# Patient Record
Sex: Female | Born: 1960 | Race: Black or African American | Hispanic: No | Marital: Single | Smoking: Never smoker
Health system: Southern US, Community
[De-identification: ages and names within clinical notes are randomized; demographics above are authoritative.]

## PROBLEM LIST (undated history)

## (undated) DIAGNOSIS — T7840XA Allergy, unspecified, initial encounter: Secondary | ICD-10-CM

## (undated) DIAGNOSIS — F329 Major depressive disorder, single episode, unspecified: Secondary | ICD-10-CM

## (undated) DIAGNOSIS — F32A Depression, unspecified: Secondary | ICD-10-CM

## (undated) DIAGNOSIS — M1990 Unspecified osteoarthritis, unspecified site: Secondary | ICD-10-CM

## (undated) DIAGNOSIS — E785 Hyperlipidemia, unspecified: Secondary | ICD-10-CM

## (undated) HISTORY — DX: Hyperlipidemia, unspecified: E78.5

## (undated) HISTORY — DX: Allergy, unspecified, initial encounter: T78.40XA

## (undated) HISTORY — DX: Depression, unspecified: F32.A

## (undated) HISTORY — DX: Major depressive disorder, single episode, unspecified: F32.9

## (undated) HISTORY — PX: UTERINE FIBROID EMBOLIZATION: SHX825

## (undated) HISTORY — DX: Unspecified osteoarthritis, unspecified site: M19.90

---

## 2000-01-21 ENCOUNTER — Ambulatory Visit (HOSPITAL_COMMUNITY): Admission: RE | Admit: 2000-01-21 | Discharge: 2000-01-21 | Payer: Self-pay | Admitting: Family Medicine

## 2000-01-21 ENCOUNTER — Encounter: Payer: Self-pay | Admitting: Family Medicine

## 2004-05-07 ENCOUNTER — Other Ambulatory Visit: Admission: RE | Admit: 2004-05-07 | Discharge: 2004-05-07 | Payer: Self-pay | Admitting: Obstetrics and Gynecology

## 2004-12-05 ENCOUNTER — Encounter: Admission: RE | Admit: 2004-12-05 | Discharge: 2004-12-05 | Payer: Self-pay | Admitting: Obstetrics and Gynecology

## 2006-08-17 ENCOUNTER — Encounter: Admission: RE | Admit: 2006-08-17 | Discharge: 2006-08-17 | Payer: Self-pay | Admitting: Obstetrics and Gynecology

## 2006-08-18 ENCOUNTER — Other Ambulatory Visit: Admission: RE | Admit: 2006-08-18 | Discharge: 2006-08-18 | Payer: Self-pay | Admitting: Obstetrics and Gynecology

## 2006-08-19 ENCOUNTER — Encounter: Admission: RE | Admit: 2006-08-19 | Discharge: 2006-08-19 | Payer: Self-pay | Admitting: Obstetrics and Gynecology

## 2006-10-08 ENCOUNTER — Encounter: Admission: RE | Admit: 2006-10-08 | Discharge: 2006-10-08 | Payer: Self-pay | Admitting: Diagnostic Radiology

## 2006-10-12 ENCOUNTER — Ambulatory Visit (HOSPITAL_COMMUNITY): Admission: RE | Admit: 2006-10-12 | Discharge: 2006-10-12 | Payer: Self-pay | Admitting: Diagnostic Radiology

## 2006-10-16 ENCOUNTER — Inpatient Hospital Stay (HOSPITAL_COMMUNITY): Admission: RE | Admit: 2006-10-16 | Discharge: 2006-10-16 | Payer: Self-pay | Admitting: Diagnostic Radiology

## 2006-12-08 ENCOUNTER — Encounter: Admission: RE | Admit: 2006-12-08 | Discharge: 2006-12-08 | Payer: Self-pay | Admitting: Diagnostic Radiology

## 2007-04-14 ENCOUNTER — Encounter: Admission: RE | Admit: 2007-04-14 | Discharge: 2007-04-14 | Payer: Self-pay | Admitting: Diagnostic Radiology

## 2007-04-18 ENCOUNTER — Encounter: Admission: RE | Admit: 2007-04-18 | Discharge: 2007-04-18 | Payer: Self-pay | Admitting: Diagnostic Radiology

## 2007-04-19 ENCOUNTER — Encounter: Admission: RE | Admit: 2007-04-19 | Discharge: 2007-04-19 | Payer: Self-pay | Admitting: Diagnostic Radiology

## 2009-05-30 ENCOUNTER — Other Ambulatory Visit: Admission: RE | Admit: 2009-05-30 | Discharge: 2009-05-30 | Payer: Self-pay | Admitting: Nurse Practitioner

## 2010-09-09 ENCOUNTER — Other Ambulatory Visit: Admission: RE | Admit: 2010-09-09 | Discharge: 2010-09-09 | Payer: Self-pay | Admitting: Obstetrics and Gynecology

## 2010-11-23 ENCOUNTER — Encounter: Payer: Self-pay | Admitting: Diagnostic Radiology

## 2012-02-25 ENCOUNTER — Other Ambulatory Visit (HOSPITAL_COMMUNITY)
Admission: RE | Admit: 2012-02-25 | Discharge: 2012-02-25 | Disposition: A | Discharge status: Home / Self Care | Payer: 59 | Source: Ambulatory Visit | Attending: Obstetrics and Gynecology | Admitting: Obstetrics and Gynecology

## 2012-02-25 ENCOUNTER — Other Ambulatory Visit: Payer: Self-pay | Admitting: Obstetrics and Gynecology

## 2012-02-25 DIAGNOSIS — Z01419 Encounter for gynecological examination (general) (routine) without abnormal findings: Secondary | ICD-10-CM | POA: Insufficient documentation

## 2012-03-24 ENCOUNTER — Other Ambulatory Visit: Payer: Self-pay | Admitting: Obstetrics and Gynecology

## 2013-03-20 ENCOUNTER — Other Ambulatory Visit (HOSPITAL_COMMUNITY)
Admission: RE | Admit: 2013-03-20 | Discharge: 2013-03-20 | Disposition: A | Discharge status: Home / Self Care | Payer: Commercial Indemnity | Source: Ambulatory Visit | Attending: Obstetrics and Gynecology | Admitting: Obstetrics and Gynecology

## 2013-03-20 ENCOUNTER — Other Ambulatory Visit: Payer: Self-pay | Admitting: Obstetrics and Gynecology

## 2013-03-20 DIAGNOSIS — R87810 Cervical high risk human papillomavirus (HPV) DNA test positive: Secondary | ICD-10-CM | POA: Insufficient documentation

## 2013-03-20 DIAGNOSIS — Z01419 Encounter for gynecological examination (general) (routine) without abnormal findings: Secondary | ICD-10-CM | POA: Insufficient documentation

## 2013-03-20 DIAGNOSIS — Z1151 Encounter for screening for human papillomavirus (HPV): Secondary | ICD-10-CM | POA: Insufficient documentation

## 2013-11-27 ENCOUNTER — Other Ambulatory Visit (HOSPITAL_COMMUNITY)
Admission: RE | Admit: 2013-11-27 | Discharge: 2013-11-27 | Disposition: A | Discharge status: Home / Self Care | Payer: Commercial Indemnity | Source: Ambulatory Visit | Attending: Obstetrics and Gynecology | Admitting: Obstetrics and Gynecology

## 2013-11-27 ENCOUNTER — Other Ambulatory Visit: Payer: Self-pay | Admitting: Obstetrics and Gynecology

## 2013-11-27 DIAGNOSIS — R87810 Cervical high risk human papillomavirus (HPV) DNA test positive: Secondary | ICD-10-CM | POA: Insufficient documentation

## 2013-11-27 DIAGNOSIS — Z124 Encounter for screening for malignant neoplasm of cervix: Secondary | ICD-10-CM | POA: Insufficient documentation

## 2013-11-27 DIAGNOSIS — Z1151 Encounter for screening for human papillomavirus (HPV): Secondary | ICD-10-CM | POA: Insufficient documentation

## 2013-12-18 ENCOUNTER — Other Ambulatory Visit: Payer: Self-pay | Admitting: Obstetrics and Gynecology

## 2015-04-02 ENCOUNTER — Other Ambulatory Visit: Payer: Self-pay | Admitting: Obstetrics and Gynecology

## 2015-04-02 ENCOUNTER — Other Ambulatory Visit (HOSPITAL_COMMUNITY)
Admission: RE | Admit: 2015-04-02 | Discharge: 2015-04-02 | Disposition: A | Discharge status: Home / Self Care | Payer: Commercial Indemnity | Source: Ambulatory Visit | Attending: Obstetrics and Gynecology | Admitting: Obstetrics and Gynecology

## 2015-04-02 DIAGNOSIS — Z01411 Encounter for gynecological examination (general) (routine) with abnormal findings: Secondary | ICD-10-CM | POA: Diagnosis present

## 2015-04-02 DIAGNOSIS — Z1151 Encounter for screening for human papillomavirus (HPV): Secondary | ICD-10-CM | POA: Diagnosis present

## 2015-04-02 DIAGNOSIS — R87810 Cervical high risk human papillomavirus (HPV) DNA test positive: Secondary | ICD-10-CM | POA: Insufficient documentation

## 2015-04-02 DIAGNOSIS — Z1231 Encounter for screening mammogram for malignant neoplasm of breast: Secondary | ICD-10-CM

## 2015-04-03 LAB — CYTOLOGY - PAP

## 2015-04-15 ENCOUNTER — Ambulatory Visit: Payer: Commercial Indemnity

## 2015-04-22 ENCOUNTER — Ambulatory Visit
Admission: RE | Admit: 2015-04-22 | Discharge: 2015-04-22 | Disposition: A | Discharge status: Home / Self Care | Payer: Commercial Indemnity | Source: Ambulatory Visit | Attending: Obstetrics and Gynecology | Admitting: Obstetrics and Gynecology

## 2015-04-22 DIAGNOSIS — Z1231 Encounter for screening mammogram for malignant neoplasm of breast: Secondary | ICD-10-CM

## 2015-10-01 ENCOUNTER — Other Ambulatory Visit (HOSPITAL_COMMUNITY)
Admission: RE | Admit: 2015-10-01 | Discharge: 2015-10-01 | Disposition: A | Discharge status: Home / Self Care | Payer: Commercial Indemnity | Source: Ambulatory Visit | Attending: Obstetrics and Gynecology | Admitting: Obstetrics and Gynecology

## 2015-10-01 ENCOUNTER — Other Ambulatory Visit: Payer: Self-pay | Admitting: Obstetrics and Gynecology

## 2015-10-01 DIAGNOSIS — Z1151 Encounter for screening for human papillomavirus (HPV): Secondary | ICD-10-CM | POA: Diagnosis not present

## 2015-10-01 DIAGNOSIS — Z01411 Encounter for gynecological examination (general) (routine) with abnormal findings: Secondary | ICD-10-CM | POA: Insufficient documentation

## 2015-10-02 LAB — CYTOLOGY - PAP

## 2015-11-22 ENCOUNTER — Other Ambulatory Visit: Payer: Self-pay | Admitting: Obstetrics and Gynecology

## 2016-02-08 IMAGING — MG MM SCREEN MAMMOGRAM BILATERAL
5 series · 5 of 5 positions shown · non-contrast
Comparison: None.

CLINICAL DATA: Screening.

EXAM:
DIGITAL SCREENING BILATERAL MAMMOGRAM WITH CAD

[R CC (1 of 2)]
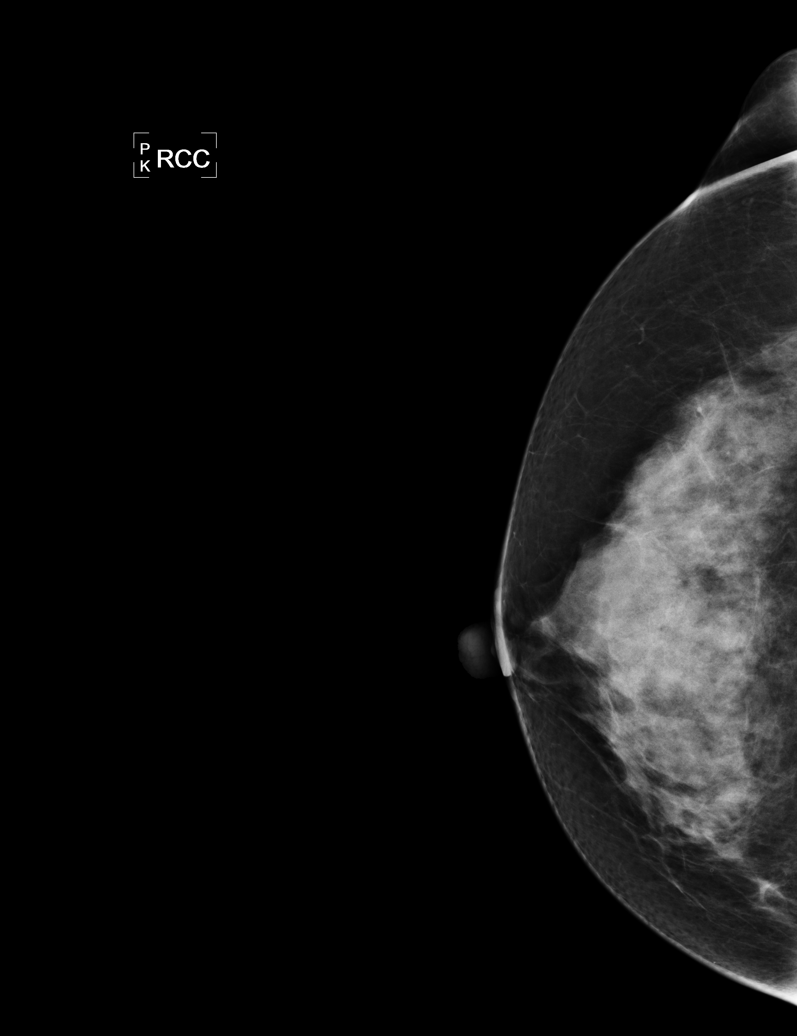

[L CC]
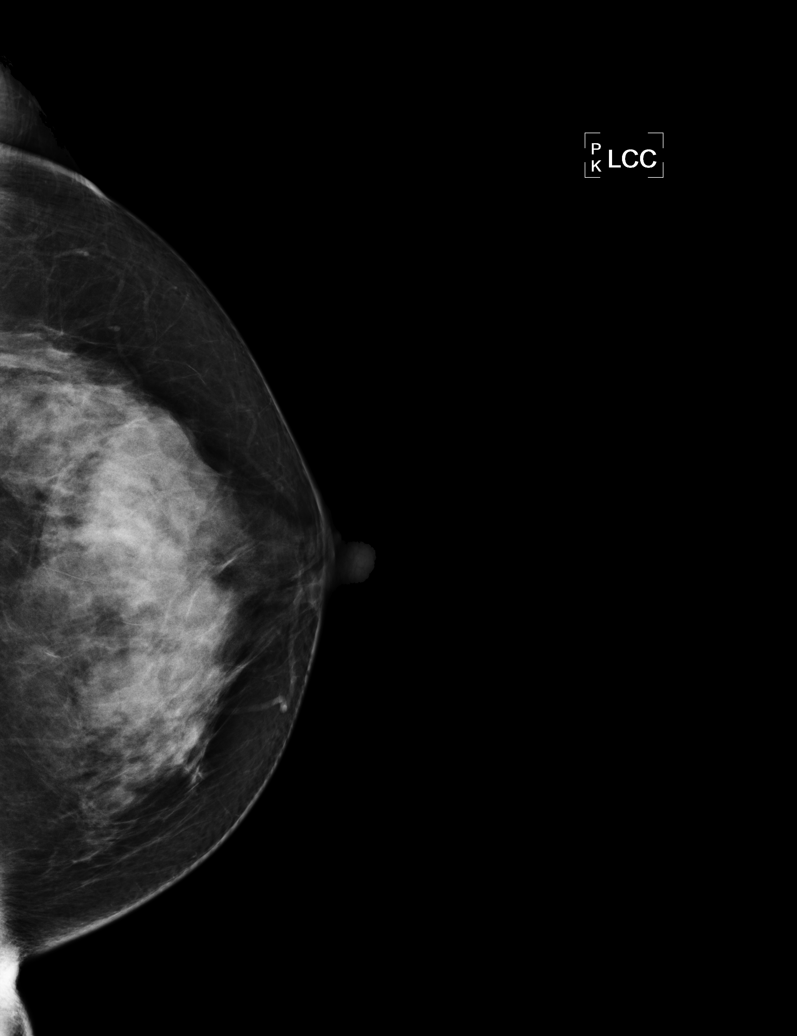

[L MLO]
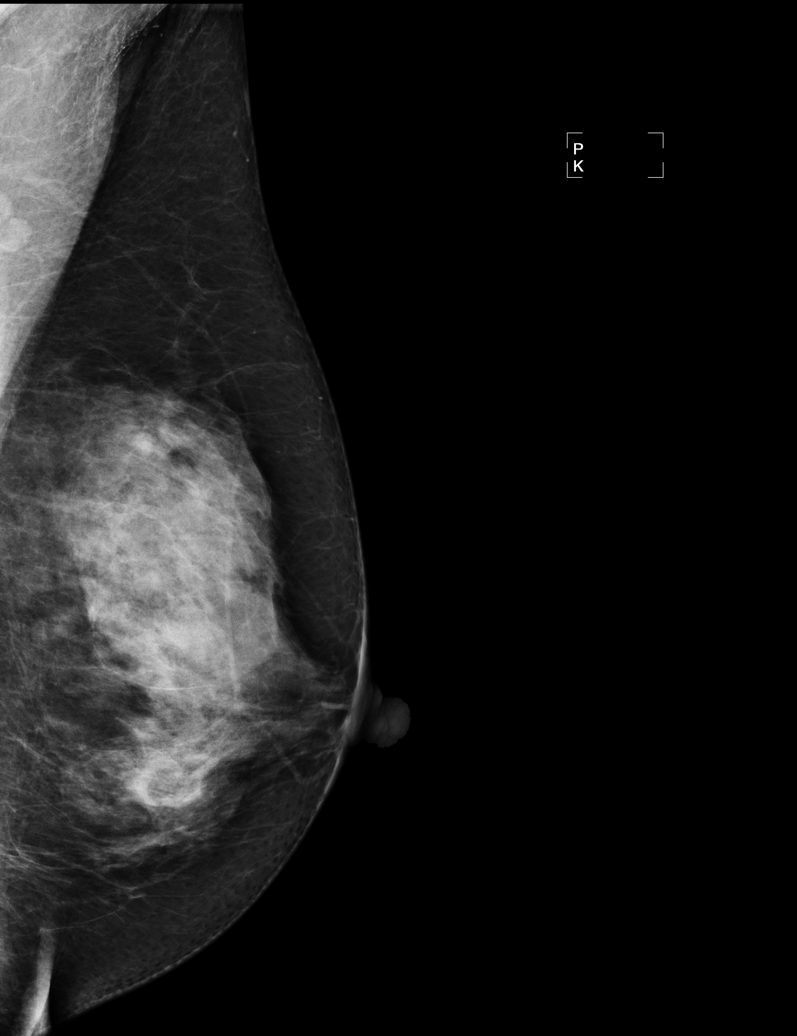

[R MLO]
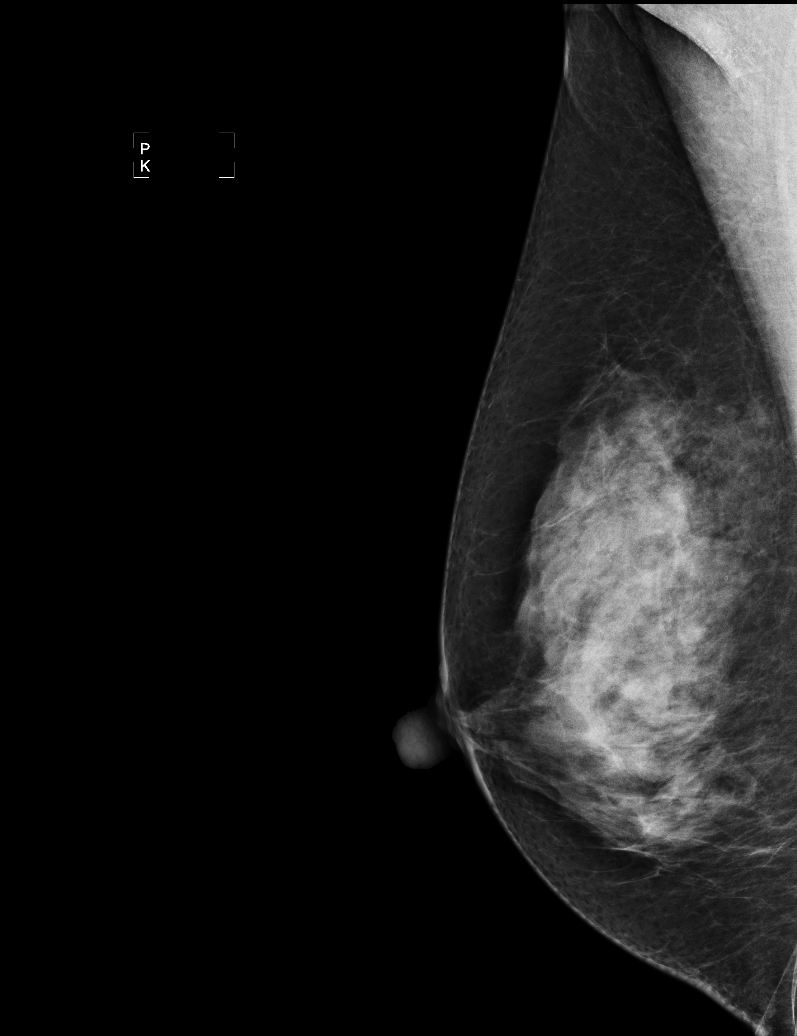

[R CC (2 of 2)]
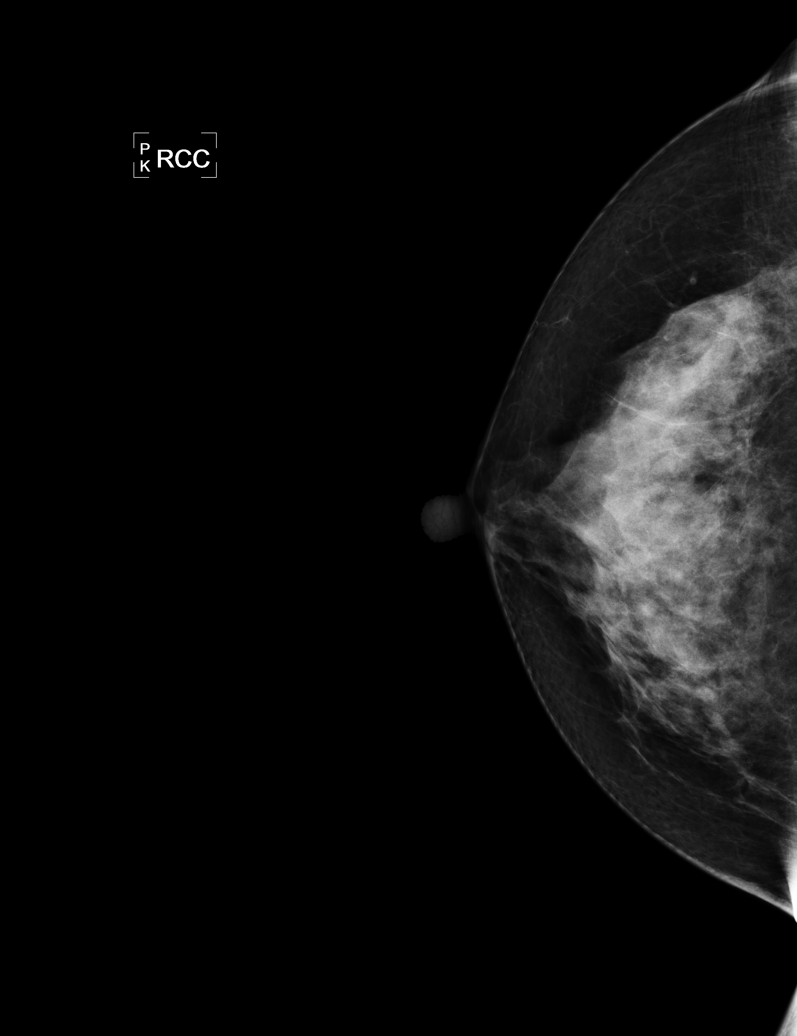

[5 of 5 positions shown; findings below may reference images not displayed]

ACR Breast Density Category d: The breast tissue is extremely dense,
which lowers the sensitivity of mammography.
FINDINGS: There are no findings suspicious for malignancy. Images were
processed with CAD.
IMPRESSION: No mammographic evidence of malignancy. A result letter of this
screening mammogram will be mailed directly to the patient.

RECOMMENDATION:
Screening mammogram in one year. (Code:8A-L-C49)

BI-RADS CATEGORY  1: Negative.

## 2016-04-06 ENCOUNTER — Other Ambulatory Visit (HOSPITAL_COMMUNITY)
Admission: RE | Admit: 2016-04-06 | Discharge: 2016-04-06 | Disposition: A | Discharge status: Home / Self Care | Payer: Managed Care, Other (non HMO) | Source: Ambulatory Visit | Attending: Obstetrics and Gynecology | Admitting: Obstetrics and Gynecology

## 2016-04-06 ENCOUNTER — Other Ambulatory Visit: Payer: Self-pay | Admitting: Obstetrics and Gynecology

## 2016-04-06 DIAGNOSIS — Z1151 Encounter for screening for human papillomavirus (HPV): Secondary | ICD-10-CM | POA: Insufficient documentation

## 2016-04-06 DIAGNOSIS — Z01411 Encounter for gynecological examination (general) (routine) with abnormal findings: Secondary | ICD-10-CM | POA: Diagnosis present

## 2016-04-10 LAB — CYTOLOGY - PAP

## 2017-04-12 ENCOUNTER — Other Ambulatory Visit: Payer: Self-pay | Admitting: Obstetrics and Gynecology

## 2017-04-12 ENCOUNTER — Other Ambulatory Visit (HOSPITAL_COMMUNITY)
Admission: RE | Admit: 2017-04-12 | Discharge: 2017-04-12 | Disposition: A | Discharge status: Home / Self Care | Payer: Managed Care, Other (non HMO) | Source: Ambulatory Visit | Attending: Obstetrics and Gynecology | Admitting: Obstetrics and Gynecology

## 2017-04-12 DIAGNOSIS — Z1151 Encounter for screening for human papillomavirus (HPV): Secondary | ICD-10-CM | POA: Insufficient documentation

## 2017-04-12 DIAGNOSIS — Z01419 Encounter for gynecological examination (general) (routine) without abnormal findings: Secondary | ICD-10-CM | POA: Diagnosis present

## 2017-04-13 LAB — CYTOLOGY - PAP
DIAGNOSIS: NEGATIVE
HPV (WINDOPATH): NOT DETECTED

## 2018-07-21 ENCOUNTER — Encounter: Payer: Self-pay | Admitting: Family Medicine

## 2018-07-21 ENCOUNTER — Ambulatory Visit (INDEPENDENT_AMBULATORY_CARE_PROVIDER_SITE_OTHER): Payer: Managed Care, Other (non HMO) | Admitting: Family Medicine

## 2018-07-21 VITALS — BP 108/78 | HR 78 | Temp 98.7°F | Ht 67.0 in | Wt 188.0 lb

## 2018-07-21 DIAGNOSIS — J302 Other seasonal allergic rhinitis: Secondary | ICD-10-CM | POA: Diagnosis not present

## 2018-07-21 DIAGNOSIS — Z131 Encounter for screening for diabetes mellitus: Secondary | ICD-10-CM

## 2018-07-21 DIAGNOSIS — M069 Rheumatoid arthritis, unspecified: Secondary | ICD-10-CM | POA: Diagnosis not present

## 2018-07-21 DIAGNOSIS — Z13220 Encounter for screening for lipoid disorders: Secondary | ICD-10-CM

## 2018-07-21 DIAGNOSIS — Z8639 Personal history of other endocrine, nutritional and metabolic disease: Secondary | ICD-10-CM

## 2018-07-21 DIAGNOSIS — Z0000 Encounter for general adult medical examination without abnormal findings: Secondary | ICD-10-CM

## 2018-07-21 DIAGNOSIS — Z0001 Encounter for general adult medical examination with abnormal findings: Secondary | ICD-10-CM | POA: Diagnosis not present

## 2018-07-21 NOTE — Progress Notes (Signed)
Subjective:     Sharon Berry is a 57 y.o. female and is here for a comprehensive physical exam. The patient reports problems - Rheumatoid arthritis, seasonal allergies,h/o vit D deficiency.   RA: -seen by Dr. Nickola Major in the past -not currently on meds -may take BC powder and turmeric  Seasonal allergies: -OTC zyrtec  H/o uterine fibroid, followed by OB/Gyn.  Social History   Socioeconomic History  . Marital status: Single    Spouse name: Not on file  . Number of children: Not on file  . Years of education: Not on file  . Highest education level: Not on file  Occupational History  . Not on file  Social Needs  . Financial resource strain: Not on file  . Food insecurity:    Worry: Not on file    Inability: Not on file  . Transportation needs:    Medical: Not on file    Non-medical: Not on file  Tobacco Use  . Smoking status: Never Smoker  . Smokeless tobacco: Never Used  Substance and Sexual Activity  . Alcohol use: Yes  . Drug use: Never  . Sexual activity: Not on file  Lifestyle  . Physical activity:    Days per week: Not on file    Minutes per session: Not on file  . Stress: Not on file  Relationships  . Social connections:    Talks on phone: Not on file    Gets together: Not on file    Attends religious service: Not on file    Active member of club or organization: Not on file    Attends meetings of clubs or organizations: Not on file    Relationship status: Not on file  . Intimate partner violence:    Fear of current or ex partner: Not on file    Emotionally abused: Not on file    Physically abused: Not on file    Forced sexual activity: Not on file  Other Topics Concern  . Not on file  Social History Narrative  . Not on file   Health Maintenance  Topic Date Due  . Hepatitis C Screening  03/03/1962  . HIV Screening  02/19/1977  . TETANUS/TDAP  02/19/1981  . COLONOSCOPY  02/20/2012  . MAMMOGRAM  04/21/2017  . INFLUENZA VACCINE  06/02/2018  .  PAP SMEAR  04/12/2020    The following portions of the patient's history were reviewed and updated as appropriate: allergies, current medications, past family history, past medical history, past social history, past surgical history and problem list.  Review of Systems A comprehensive review of systems was negative.   Objective:    BP 108/78 (BP Location: Left Arm, Patient Position: Sitting, Cuff Size: Normal)   Pulse 78   Temp 98.7 F (37.1 C) (Oral)   Ht 5\' 7"  (1.702 m)   Wt 188 lb (85.3 kg)   SpO2 97%   BMI 29.44 kg/m  General appearance: alert, cooperative and no distress Head: Normocephalic, without obvious abnormality, atraumatic Eyes: conjunctivae/corneas clear. PERRL, EOM's intact. Fundi benign. Ears: normal TM's and external ear canals both ears Nose: Nares normal. Septum midline. Mucosa normal. No drainage or sinus tenderness. Throat: lips, mucosa, and tongue normal; teeth and gums normal Neck: no adenopathy, no carotid bruit, no JVD, supple, symmetrical, trachea midline and thyroid not enlarged, symmetric, no tenderness/mass/nodules Lungs: clear to auscultation bilaterally Heart: regular rate and rhythm, S1, S2 normal, no murmur, click, rub or gallop Abdomen: soft, non-tender; bowel sounds normal; no  masses,  no organomegaly Extremities: extremities normal, atraumatic, no cyanosis or edema Skin: Skin color, texture, turgor normal. No rashes or lesions Neurologic: Alert and oriented X 3, normal strength and tone. Normal symmetric reflexes. Normal coordination and gait    Assessment:    Healthy female exam.      Plan:     Anticipatory guidance given including wearing seatbelts, smoke detectors in the home, increasing physical activity, increasing p.o. intake of water and vegetables. -will obtain labs CBC, BMP, lipids, Vit D, Hgb A1C -pt to schedule mammogram See After Visit Summary for Counseling Recommendations    RA -discussed scheduling f/u with  Rheumatology -continue turmeric -consider water aerobics  Seasonal allergies -continue OTC Zyrtec prn  F/u prn  Abbe Amsterdam, MD

## 2018-07-21 NOTE — Patient Instructions (Addendum)
The lab is open each weekday from 7:30 am to 5:30 pm  Preventive Care 40-64 Years, Female Preventive care refers to lifestyle choices and visits with your health care provider that can promote health and wellness. What does preventive care include?  A yearly physical exam. This is also called an annual well check.  Dental exams once or twice a year.  Routine eye exams. Ask your health care provider how often you should have your eyes checked.  Personal lifestyle choices, including: ? Daily care of your teeth and gums. ? Regular physical activity. ? Eating a healthy diet. ? Avoiding tobacco and drug use. ? Limiting alcohol use. ? Practicing safe sex. ? Taking low-dose aspirin daily starting at age 44. ? Taking vitamin and mineral supplements as recommended by your health care provider. What happens during an annual well check? The services and screenings done by your health care provider during your annual well check will depend on your age, overall health, lifestyle risk factors, and family history of disease. Counseling Your health care provider may ask you questions about your:  Alcohol use.  Tobacco use.  Drug use.  Emotional well-being.  Home and relationship well-being.  Sexual activity.  Eating habits.  Work and work Statistician.  Method of birth control.  Menstrual cycle.  Pregnancy history.  Screening You may have the following tests or measurements:  Height, weight, and BMI.  Blood pressure.  Lipid and cholesterol levels. These may be checked every 5 years, or more frequently if you are over 2 years old.  Skin check.  Lung cancer screening. You may have this screening every year starting at age 61 if you have a 30-pack-year history of smoking and currently smoke or have quit within the past 15 years.  Fecal occult blood test (FOBT) of the stool. You may have this test every year starting at age 16.  Flexible sigmoidoscopy or colonoscopy. You  may have a sigmoidoscopy every 5 years or a colonoscopy every 10 years starting at age 87.  Hepatitis C blood test.  Hepatitis B blood test.  Sexually transmitted disease (STD) testing.  Diabetes screening. This is done by checking your blood sugar (glucose) after you have not eaten for a while (fasting). You may have this done every 1-3 years.  Mammogram. This may be done every 1-2 years. Talk to your health care provider about when you should start having regular mammograms. This may depend on whether you have a family history of breast cancer.  BRCA-related cancer screening. This may be done if you have a family history of breast, ovarian, tubal, or peritoneal cancers.  Pelvic exam and Pap test. This may be done every 3 years starting at age 70. Starting at age 72, this may be done every 5 years if you have a Pap test in combination with an HPV test.  Bone density scan. This is done to screen for osteoporosis. You may have this scan if you are at high risk for osteoporosis.  Discuss your test results, treatment options, and if necessary, the need for more tests with your health care provider. Vaccines Your health care provider may recommend certain vaccines, such as:  Influenza vaccine. This is recommended every year.  Tetanus, diphtheria, and acellular pertussis (Tdap, Td) vaccine. You may need a Td booster every 10 years.  Varicella vaccine. You may need this if you have not been vaccinated.  Zoster vaccine. You may need this after age 60.  Measles, mumps, and rubella (MMR) vaccine. You  may need at least one dose of MMR if you were born in 1957 or later. You may also need a second dose.  Pneumococcal 13-valent conjugate (PCV13) vaccine. You may need this if you have certain conditions and were not previously vaccinated.  Pneumococcal polysaccharide (PPSV23) vaccine. You may need one or two doses if you smoke cigarettes or if you have certain conditions.  Meningococcal  vaccine. You may need this if you have certain conditions.  Hepatitis A vaccine. You may need this if you have certain conditions or if you travel or work in places where you may be exposed to hepatitis A.  Hepatitis B vaccine. You may need this if you have certain conditions or if you travel or work in places where you may be exposed to hepatitis B.  Haemophilus influenzae type b (Hib) vaccine. You may need this if you have certain conditions.  Talk to your health care provider about which screenings and vaccines you need and how often you need them. This information is not intended to replace advice given to you by your health care provider. Make sure you discuss any questions you have with your health care provider. Document Released: 11/15/2015 Document Revised: 07/08/2016 Document Reviewed: 08/20/2015 Elsevier Interactive Patient Education  2018 [place].  Rheumatoid Arthritis Rheumatoid arthritis (RA) is a long-term (chronic) disease that causes inflammation in your joints. RA may start slowly. It usually affects the small joints of the hands and feet. Usually, the same joints are affected on both sides of your body. Inflammation from RA can also affect other parts of your body, including your heart, eyes, or lungs. RA is an autoimmune disease. That means that your body's defense system (immune system) mistakenly attacks healthy body tissues. There is no cure for RA, but medicines can help your symptoms and halt or slow down the progression of the disease. What are the causes? The exact cause of RA is not known. What increases the risk? This condition is more likely to develop in:  Women.  People who have a family history of RA or other autoimmune diseases.  What are the signs or symptoms? Symptoms of this condition vary from person to person. Symptoms usually start gradually. They are often worse in the morning. The first symptom may be morning stiffness that lasts longer than  30 minutes. As RA progresses, symptoms may include:  Pain, stiffness, swelling, warmth, and tenderness in joints on both sides of your body.  Loss of energy.  Loss of appetite.  Weight loss.  Low-grade fever.  Dry eyes and dry mouth.  Firm lumps (rheumatoid nodules) that grow beneath your skin in areas such as your forearm bones near your elbows and on your hands.  Changes in the appearance of joints (deformity) and loss of joint function.  Symptoms of RA often come and go. Sometimes, symptoms get worse for a period of time. These are called flares. How is this diagnosed? This condition is diagnosed based on your symptoms, medical history, and physical exam. You may have X-rays or MRI to check for the type of joint changes that are caused by RA. You may also have blood tests to look for:  Proteins (antibodies) that your immune system may make if you have RA. They include rheumatoid factor (RF) and anti-CCP. ? When blood tests show these proteins, you are said to have "seropositive RA." ? When blood tests do not show these proteins, you may have "seronegative RA."  Inflammation in your blood.  A low  number of red blood cells (anemia).  How is this treated? The goals of treatment are to relieve pain, reduce inflammation, and slow down or stop joint damage and disability. Treatment may include:  Lifestyle changes. It is important to rest, eat a healthy diet, and exercise.  Medicines. Your health care provider may adjust your medicines every 3 months until treatment goals are reached. Common medicines include: ? Pain relievers (analgesics). ? Corticosteroids and NSAIDs to reduce inflammation. ? Disease-modifying antirheumatic drugs (DMARDs) to try to slow the course of the disease. ? Biologic response modifiers to reduce inflammation and damage.  Physical therapy and occupational therapy.  Surgery, if you have severe joint damage. Joint replacement or fusing of joints may be  needed.  Your health care provider will work with you to identify the best treatment option for you based on assessment of the overall disease activity in your body. Follow these instructions at home:  Take over-the-counter and prescription medicines only as told by your health care provider.  Start an exercise program as told by your health care provider.  Rest when you are having a flare.  Return to your normal activities as told by your health care provider. Ask your health care provider what activities are safe for you.  Keep all follow-up visits as told by your health care provider. This is important. Where to find more information:  [employer]: www.rheumatology.[place]: www.arthritis.org Contact a health care provider if:  You have a flare-up of RA symptoms.  You have a fever.  You have side effects from your medicines. Get help right away if:  You have chest pain.  You have trouble breathing.  You quickly develop a hot, painful joint that is more severe than your usual joint aches. This information is not intended to replace advice given to you by your health care provider. Make sure you discuss any questions you have with your health care provider. Document Released: 10/16/2000 Document Revised: 03/24/2016 Document Reviewed: 08/01/2015 Elsevier Interactive Patient Education  [employer].

## 2018-07-25 ENCOUNTER — Encounter: Payer: Self-pay | Admitting: Family Medicine

## 2018-10-03 ENCOUNTER — Other Ambulatory Visit (INDEPENDENT_AMBULATORY_CARE_PROVIDER_SITE_OTHER): Payer: Managed Care, Other (non HMO)

## 2018-10-03 DIAGNOSIS — Z131 Encounter for screening for diabetes mellitus: Secondary | ICD-10-CM

## 2018-10-03 DIAGNOSIS — Z13220 Encounter for screening for lipoid disorders: Secondary | ICD-10-CM | POA: Diagnosis not present

## 2018-10-03 DIAGNOSIS — Z8639 Personal history of other endocrine, nutritional and metabolic disease: Secondary | ICD-10-CM | POA: Diagnosis not present

## 2018-10-03 DIAGNOSIS — Z0000 Encounter for general adult medical examination without abnormal findings: Secondary | ICD-10-CM | POA: Diagnosis not present

## 2018-10-03 LAB — BASIC METABOLIC PANEL
BUN: 14 mg/dL (ref 6–23)
CHLORIDE: 102 meq/L (ref 96–112)
CO2: 28 meq/L (ref 19–32)
Calcium: 9.1 mg/dL (ref 8.4–10.5)
Creatinine, Ser: 0.69 mg/dL (ref 0.40–1.20)
GFR: 112.93 mL/min (ref >60.00)
Glucose, Bld: 85 mg/dL (ref 70–99)
POTASSIUM: 4.6 meq/L (ref 3.5–5.1)
SODIUM: 139 meq/L (ref 135–145)

## 2018-10-03 LAB — CBC
HCT: 36.5 % (ref 36.0–46.0)
Hemoglobin: 12.1 g/dL (ref 12.0–15.0)
MCHC: 33.1 g/dL (ref 30.0–36.0)
MCV: 88.9 fl (ref 78.0–100.0)
Platelets: 236 10*3/uL (ref 150.0–400.0)
RBC: 4.11 Mil/uL (ref 3.87–5.11)
RDW: 14.4 % (ref 11.5–15.5)
WBC: 6 10*3/uL (ref 4.0–10.5)

## 2018-10-03 LAB — LIPID PANEL
CHOLESTEROL: 280 mg/dL — AB (ref 0–200)
HDL: 55.1 mg/dL (ref >39.00)
LDL CALC: 212 mg/dL — AB (ref 0–99)
NonHDL: 224.93
TRIGLYCERIDES: 66 mg/dL (ref 0.0–149.0)
Total CHOL/HDL Ratio: 5
VLDL: 13.2 mg/dL (ref 0.0–40.0)

## 2018-10-03 LAB — HEMOGLOBIN A1C: HEMOGLOBIN A1C: 6 % (ref 4.6–6.5)

## 2018-10-03 LAB — VITAMIN D 25 HYDROXY (VIT D DEFICIENCY, FRACTURES): VITD: 67.34 ng/mL (ref 30.00–100.00)

## 2018-10-07 ENCOUNTER — Other Ambulatory Visit: Payer: Self-pay | Admitting: Family Medicine

## 2018-10-07 DIAGNOSIS — E7800 Pure hypercholesterolemia, unspecified: Secondary | ICD-10-CM

## 2020-07-22 DIAGNOSIS — N952 Postmenopausal atrophic vaginitis: Secondary | ICD-10-CM | POA: Diagnosis not present

## 2020-07-22 DIAGNOSIS — Z8742 Personal history of other diseases of the female genital tract: Secondary | ICD-10-CM | POA: Diagnosis not present

## 2020-07-22 DIAGNOSIS — Z0000 Encounter for general adult medical examination without abnormal findings: Secondary | ICD-10-CM | POA: Diagnosis not present

## 2020-08-09 DIAGNOSIS — Z0000 Encounter for general adult medical examination without abnormal findings: Secondary | ICD-10-CM | POA: Diagnosis not present

## 2020-08-09 DIAGNOSIS — R739 Hyperglycemia, unspecified: Secondary | ICD-10-CM | POA: Diagnosis not present

## 2020-08-09 DIAGNOSIS — Z13220 Encounter for screening for lipoid disorders: Secondary | ICD-10-CM | POA: Diagnosis not present

## 2020-08-09 DIAGNOSIS — E559 Vitamin D deficiency, unspecified: Secondary | ICD-10-CM | POA: Diagnosis not present

## 2020-10-04 DIAGNOSIS — E782 Mixed hyperlipidemia: Secondary | ICD-10-CM | POA: Diagnosis not present

## 2022-03-19 DIAGNOSIS — M069 Rheumatoid arthritis, unspecified: Secondary | ICD-10-CM | POA: Insufficient documentation

## 2022-11-02 DIAGNOSIS — Z419 Encounter for procedure for purposes other than remedying health state, unspecified: Secondary | ICD-10-CM | POA: Diagnosis not present

## 2022-12-03 DIAGNOSIS — Z419 Encounter for procedure for purposes other than remedying health state, unspecified: Secondary | ICD-10-CM | POA: Diagnosis not present

## 2023-01-01 DIAGNOSIS — Z419 Encounter for procedure for purposes other than remedying health state, unspecified: Secondary | ICD-10-CM | POA: Diagnosis not present

## 2023-02-01 DIAGNOSIS — Z419 Encounter for procedure for purposes other than remedying health state, unspecified: Secondary | ICD-10-CM | POA: Diagnosis not present

## 2023-03-03 DIAGNOSIS — Z419 Encounter for procedure for purposes other than remedying health state, unspecified: Secondary | ICD-10-CM | POA: Diagnosis not present

## 2023-04-03 DIAGNOSIS — Z419 Encounter for procedure for purposes other than remedying health state, unspecified: Secondary | ICD-10-CM | POA: Diagnosis not present

## 2023-05-01 DIAGNOSIS — R112 Nausea with vomiting, unspecified: Secondary | ICD-10-CM | POA: Diagnosis not present

## 2023-05-01 DIAGNOSIS — R030 Elevated blood-pressure reading, without diagnosis of hypertension: Secondary | ICD-10-CM | POA: Diagnosis not present

## 2023-05-01 DIAGNOSIS — R1084 Generalized abdominal pain: Secondary | ICD-10-CM | POA: Diagnosis not present

## 2023-05-03 DIAGNOSIS — Z419 Encounter for procedure for purposes other than remedying health state, unspecified: Secondary | ICD-10-CM | POA: Diagnosis not present

## 2023-05-04 DIAGNOSIS — R1084 Generalized abdominal pain: Secondary | ICD-10-CM | POA: Diagnosis not present

## 2023-05-05 ENCOUNTER — Inpatient Hospital Stay (HOSPITAL_COMMUNITY): Payer: Medicaid Other

## 2023-05-05 ENCOUNTER — Other Ambulatory Visit: Payer: Self-pay

## 2023-05-05 ENCOUNTER — Encounter (HOSPITAL_BASED_OUTPATIENT_CLINIC_OR_DEPARTMENT_OTHER): Payer: Self-pay

## 2023-05-05 ENCOUNTER — Emergency Department (HOSPITAL_BASED_OUTPATIENT_CLINIC_OR_DEPARTMENT_OTHER): Payer: Medicaid Other

## 2023-05-05 ENCOUNTER — Emergency Department (HOSPITAL_BASED_OUTPATIENT_CLINIC_OR_DEPARTMENT_OTHER): Payer: Medicaid Other | Admitting: Radiology

## 2023-05-05 ENCOUNTER — Inpatient Hospital Stay (HOSPITAL_BASED_OUTPATIENT_CLINIC_OR_DEPARTMENT_OTHER)
Admission: EM | Admit: 2023-05-05 | Discharge: 2023-05-15 | DRG: 336 | Disposition: A | Discharge status: Home / Self Care | Payer: Medicaid Other | Attending: Psychiatry | Admitting: Psychiatry

## 2023-05-05 DIAGNOSIS — I808 Phlebitis and thrombophlebitis of other sites: Secondary | ICD-10-CM | POA: Diagnosis not present

## 2023-05-05 DIAGNOSIS — Z823 Family history of stroke: Secondary | ICD-10-CM | POA: Diagnosis not present

## 2023-05-05 DIAGNOSIS — K567 Ileus, unspecified: Secondary | ICD-10-CM | POA: Diagnosis not present

## 2023-05-05 DIAGNOSIS — Z833 Family history of diabetes mellitus: Secondary | ICD-10-CM

## 2023-05-05 DIAGNOSIS — T8089XA Other complications following infusion, transfusion and therapeutic injection, initial encounter: Secondary | ICD-10-CM | POA: Diagnosis not present

## 2023-05-05 DIAGNOSIS — Z825 Family history of asthma and other chronic lower respiratory diseases: Secondary | ICD-10-CM | POA: Diagnosis not present

## 2023-05-05 DIAGNOSIS — Z8249 Family history of ischemic heart disease and other diseases of the circulatory system: Secondary | ICD-10-CM

## 2023-05-05 DIAGNOSIS — M7989 Other specified soft tissue disorders: Secondary | ICD-10-CM | POA: Diagnosis not present

## 2023-05-05 DIAGNOSIS — Z8261 Family history of arthritis: Secondary | ICD-10-CM

## 2023-05-05 DIAGNOSIS — R12 Heartburn: Secondary | ICD-10-CM | POA: Diagnosis present

## 2023-05-05 DIAGNOSIS — R944 Abnormal results of kidney function studies: Secondary | ICD-10-CM | POA: Diagnosis not present

## 2023-05-05 DIAGNOSIS — E872 Acidosis, unspecified: Secondary | ICD-10-CM | POA: Diagnosis not present

## 2023-05-05 DIAGNOSIS — E876 Hypokalemia: Secondary | ICD-10-CM | POA: Diagnosis not present

## 2023-05-05 DIAGNOSIS — R140 Abdominal distension (gaseous): Secondary | ICD-10-CM | POA: Diagnosis not present

## 2023-05-05 DIAGNOSIS — K922 Gastrointestinal hemorrhage, unspecified: Secondary | ICD-10-CM | POA: Diagnosis not present

## 2023-05-05 DIAGNOSIS — Z452 Encounter for adjustment and management of vascular access device: Secondary | ICD-10-CM | POA: Diagnosis not present

## 2023-05-05 DIAGNOSIS — Z822 Family history of deafness and hearing loss: Secondary | ICD-10-CM | POA: Diagnosis not present

## 2023-05-05 DIAGNOSIS — K56609 Unspecified intestinal obstruction, unspecified as to partial versus complete obstruction: Secondary | ICD-10-CM | POA: Diagnosis not present

## 2023-05-05 DIAGNOSIS — R000 Tachycardia, unspecified: Secondary | ICD-10-CM | POA: Diagnosis not present

## 2023-05-05 DIAGNOSIS — F32A Depression, unspecified: Secondary | ICD-10-CM | POA: Diagnosis not present

## 2023-05-05 DIAGNOSIS — Z79899 Other long term (current) drug therapy: Secondary | ICD-10-CM | POA: Diagnosis not present

## 2023-05-05 DIAGNOSIS — R112 Nausea with vomiting, unspecified: Secondary | ICD-10-CM | POA: Diagnosis not present

## 2023-05-05 DIAGNOSIS — I82612 Acute embolism and thrombosis of superficial veins of left upper extremity: Secondary | ICD-10-CM | POA: Diagnosis not present

## 2023-05-05 DIAGNOSIS — R1084 Generalized abdominal pain: Secondary | ICD-10-CM | POA: Diagnosis not present

## 2023-05-05 DIAGNOSIS — Z811 Family history of alcohol abuse and dependence: Secondary | ICD-10-CM | POA: Diagnosis not present

## 2023-05-05 DIAGNOSIS — E785 Hyperlipidemia, unspecified: Secondary | ICD-10-CM | POA: Diagnosis present

## 2023-05-05 DIAGNOSIS — D259 Leiomyoma of uterus, unspecified: Secondary | ICD-10-CM | POA: Diagnosis not present

## 2023-05-05 DIAGNOSIS — Y848 Other medical procedures as the cause of abnormal reaction of the patient, or of later complication, without mention of misadventure at the time of the procedure: Secondary | ICD-10-CM | POA: Diagnosis not present

## 2023-05-05 DIAGNOSIS — Z83438 Family history of other disorder of lipoprotein metabolism and other lipidemia: Secondary | ICD-10-CM | POA: Diagnosis not present

## 2023-05-05 DIAGNOSIS — R0789 Other chest pain: Secondary | ICD-10-CM | POA: Diagnosis not present

## 2023-05-05 DIAGNOSIS — Z91018 Allergy to other foods: Secondary | ICD-10-CM | POA: Diagnosis not present

## 2023-05-05 DIAGNOSIS — K565 Intestinal adhesions [bands], unspecified as to partial versus complete obstruction: Principal | ICD-10-CM | POA: Diagnosis present

## 2023-05-05 LAB — CBC
HCT: 41.7 % (ref 36.0–46.0)
Hemoglobin: 13.8 g/dL (ref 12.0–15.0)
MCH: 31.4 pg (ref 26.0–34.0)
MCHC: 33.1 g/dL (ref 30.0–36.0)
MCV: 94.8 fL (ref 80.0–100.0)
Platelets: 272 10*3/uL (ref 150–400)
RBC: 4.4 MIL/uL (ref 3.87–5.11)
RDW: 13.4 % (ref 11.5–15.5)
WBC: 5.5 10*3/uL (ref 4.0–10.5)
nRBC: 0 % (ref 0.0–0.2)

## 2023-05-05 LAB — COMPREHENSIVE METABOLIC PANEL
ALT: 17 U/L (ref 0–44)
AST: 35 U/L (ref 15–41)
Albumin: 4.4 g/dL (ref 3.5–5.0)
Alkaline Phosphatase: 60 U/L (ref 38–126)
Anion gap: 16 — ABNORMAL HIGH (ref 5–15)
BUN: 32 mg/dL — ABNORMAL HIGH (ref 8–23)
CO2: 27 mmol/L (ref 22–32)
Calcium: 10.2 mg/dL (ref 8.9–10.3)
Chloride: 96 mmol/L — ABNORMAL LOW (ref 98–111)
Creatinine, Ser: 0.87 mg/dL (ref 0.44–1.00)
GFR, Estimated: >60 mL/min (ref >60)
Glucose, Bld: 108 mg/dL — ABNORMAL HIGH (ref 70–99)
Potassium: 3.2 mmol/L — ABNORMAL LOW (ref 3.5–5.1)
Sodium: 139 mmol/L (ref 135–145)
Total Bilirubin: 1 mg/dL (ref 0.3–1.2)
Total Protein: 9 g/dL — ABNORMAL HIGH (ref 6.5–8.1)

## 2023-05-05 LAB — LACTIC ACID, PLASMA: Lactic Acid, Venous: 1.3 mmol/L (ref 0.5–1.9)

## 2023-05-05 LAB — LIPASE, BLOOD: Lipase: 36 U/L (ref 11–51)

## 2023-05-05 MED ORDER — ONDANSETRON HCL 4 MG PO TABS: 4.0000 mg | ORAL_TABLET | Freq: Four times a day (QID) | ORAL | Status: DC | PRN

## 2023-05-05 MED ORDER — BENZOCAINE 20 % MT AERO
INHALATION_SPRAY | Freq: Once | OROMUCOSAL | Status: AC
  Filled 2023-05-05: qty 57

## 2023-05-05 MED ORDER — ONDANSETRON HCL 4 MG/2ML IJ SOLN
4.0000 mg | Freq: Four times a day (QID) | INTRAMUSCULAR | Status: DC | PRN
  Administered 2023-05-06 – 2023-05-09 (×5): 4 mg via INTRAVENOUS
  Filled 2023-05-05 (×5): qty 2

## 2023-05-05 MED ORDER — ENOXAPARIN SODIUM 40 MG/0.4ML IJ SOSY: 40.0000 mg | PREFILLED_SYRINGE | INTRAMUSCULAR | Status: DC

## 2023-05-05 MED ORDER — SODIUM CHLORIDE 0.9% FLUSH: 3.0000 mL | INTRAVENOUS | Status: DC | PRN

## 2023-05-05 MED ORDER — POTASSIUM CHLORIDE 10 MEQ/100ML IV SOLN: 10.0000 meq | INTRAVENOUS | Status: DC

## 2023-05-05 MED ORDER — SODIUM CHLORIDE 0.9% FLUSH
3.0000 mL | Freq: Two times a day (BID) | INTRAVENOUS | Status: DC
  Administered 2023-05-06 – 2023-05-07 (×4): 3 mL via INTRAVENOUS

## 2023-05-05 MED ORDER — PANTOPRAZOLE SODIUM 40 MG IV SOLR
40.0000 mg | INTRAVENOUS | Status: DC
  Administered 2023-05-06 – 2023-05-12 (×7): 40 mg via INTRAVENOUS
  Filled 2023-05-05 (×7): qty 10

## 2023-05-05 MED ORDER — HYDRALAZINE HCL 20 MG/ML IJ SOLN: 10.0000 mg | Freq: Four times a day (QID) | INTRAMUSCULAR | Status: DC | PRN

## 2023-05-05 MED ORDER — PANTOPRAZOLE SODIUM 40 MG IV SOLR
40.0000 mg | Freq: Once | INTRAVENOUS | Status: AC
  Administered 2023-05-06: 40 mg via INTRAVENOUS
  Filled 2023-05-05: qty 10

## 2023-05-05 MED ORDER — ONDANSETRON HCL 4 MG/2ML IJ SOLN
4.0000 mg | Freq: Once | INTRAMUSCULAR | Status: AC
  Administered 2023-05-05: 4 mg via INTRAVENOUS
  Filled 2023-05-05: qty 2

## 2023-05-05 MED ORDER — MORPHINE SULFATE (PF) 4 MG/ML IV SOLN
4.0000 mg | Freq: Once | INTRAVENOUS | Status: AC
  Administered 2023-05-05: 4 mg via INTRAVENOUS
  Filled 2023-05-05: qty 1

## 2023-05-05 MED ORDER — DIATRIZOATE MEGLUMINE & SODIUM 66-10 % PO SOLN
90.0000 mL | Freq: Once | ORAL | Status: AC
  Administered 2023-05-06: 90 mL via NASOGASTRIC
  Filled 2023-05-05: qty 90

## 2023-05-05 MED ORDER — SODIUM CHLORIDE 0.9 % IV SOLN: 250.0000 mL | INTRAVENOUS | Status: DC | PRN

## 2023-05-05 MED ORDER — IOHEXOL 300 MG/ML  SOLN
100.0000 mL | Freq: Once | INTRAMUSCULAR | Status: AC | PRN
  Administered 2023-05-05: 80 mL via INTRAVENOUS

## 2023-05-05 MED ORDER — SODIUM CHLORIDE 0.9 % IV BOLUS
1000.0000 mL | Freq: Once | INTRAVENOUS | Status: AC
  Administered 2023-05-05: 1000 mL via INTRAVENOUS

## 2023-05-05 MED ORDER — POTASSIUM CHLORIDE 2 MEQ/ML IV SOLN
INTRAVENOUS | Status: DC
  Filled 2023-05-05: qty 1000

## 2023-05-05 MED ORDER — LIDOCAINE HCL URETHRAL/MUCOSAL 2 % EX GEL
1.0000 | Freq: Once | CUTANEOUS | Status: AC
  Administered 2023-05-05: 1
  Filled 2023-05-05: qty 11

## 2023-05-05 NOTE — H&P (Addendum)
History and Physical    Sharon Berry WGN:[id number] DOB: 05-04-1961 DOA: 05/05/2023  DOS: the patient was seen and examined on 05/05/2023  PCP: Carmel Sacramento, NP   Patient coming from: Home   Chief Complaint:  Chief Complaint  Patient presents with   Vomiting    HPI:  61 year old female medical history of hyperlipidemia presented initially to emergency department at [facility] on 05/05/2023 with complaining of generalized abdominal pain, nausea vomiting which has not started on 05/01/2023.  Patient reporting me that she went to urgent care outpatient [place] they did an x-ray which initially did not showed any evidence of small bowel obstruction.  As she continues to have generalized abdominal pain she came to ED for evaluation.  At the presentation to ED [facility] CT abdomen pelvis obtained which showed distended stomach and small bowel with air-fluid level with a transition point noted at the pelvis compatible with a small bowel obstruction.  ED physician reached out to general surgery on-call Dr. Derrell Lolling who recommended NG tube placement and transport patient to the [facility]. During my evaluation, patient reported her abdominal pain has been subsided since the NG tube suction has been initiated.  She is to have some heartburn and feeling nauseated.  Denies any vomiting.  Patient reporting vomiting throughout the day yesterday and she has last bowel movement on 05/04/2023 and she is able to pass flatus.  ED Course:  At ED initial presentation patient's vital stable except slightly.  Blood pressure 156/83. CBC unremarkable. CMP showed slightly low potassium 3.8, elevated BUN 32 and anion gap 16. Lipase 36 within normal range.  Lactic acid 1.3 within normal range. CT abdomen finding as mentioned above. NG tube placed in the ED and following abdominal x-ray showed appropriate placement of the NG tube in the stomach. In the ED patient got lidocaine, morphine, Zofran and  benzocaine once.  Review of Systems:  Review of Systems  Constitutional:  Negative for chills, fever and malaise/fatigue.  Cardiovascular:  Negative for chest pain, palpitations and leg swelling.  Gastrointestinal:  Positive for abdominal pain, heartburn and nausea. Negative for blood in stool, constipation, diarrhea, melena and vomiting.  Musculoskeletal:  Negative for neck pain.  Neurological:  Negative for headaches.    Past Medical History:  Diagnosis Date   Allergy    Arthritis    Depression    Hyperlipidemia     History reviewed. No pertinent surgical history.   reports that she has never smoked. She has never been exposed to tobacco smoke. She has never used smokeless tobacco. She reports current alcohol use. She reports that she does not use drugs.  Allergies  Allergen Reactions   Spinach     Family History  Problem Relation Age of Onset   Arthritis Mother    Hearing loss Mother    Hypertension Mother    Arthritis Father    Alcohol abuse Father    Cancer Father    Arthritis Sister    Hypertension Sister    COPD Sister    Cancer Sister    Hypertension Brother    Hyperlipidemia Brother    Diabetes Brother    Hypertension Maternal Grandfather    Hyperlipidemia Maternal Grandfather    Stroke Maternal Grandfather     Prior to Admission medications   Medication Sig Start Date End Date Taking? Authorizing Provider  Cholecalciferol (VITAMIN D PO) Take 5,000 Units by mouth daily.   Yes [provider]  TURMERIC PO Take by mouth daily.  Yes [provider]     Physical Exam: Vitals:   05/05/23 2006 05/05/23 2100 05/05/23 2130 05/05/23 2232  BP: (!) 160/85 (!) 143/87 (!) 161/85 (!) 162/76  Pulse:  70 74 74  Resp: 16 16 18 18   Temp: 98 F (36.7 C)  98 F (36.7 C) 98.1 F (36.7 C)  TempSrc:    Oral  SpO2: 99% 99% 100% 100%  Weight:    65.6 kg  Height:        Physical Exam Constitutional:      General: She is not in acute  distress.    Appearance: She is not ill-appearing.  HENT:     Head: Normocephalic.     Mouth/Throat:     Mouth: Mucous membranes are dry.  Cardiovascular:     Rate and Rhythm: Normal rate and regular rhythm.  Pulmonary:     Effort: Pulmonary effort is normal.     Breath sounds: Normal breath sounds.  Abdominal:     General: Abdomen is flat. Bowel sounds are normal. There is no distension.     Tenderness: There is no abdominal tenderness. There is no guarding or rebound.     Comments: Bowel sounds present all 4 quadrant  Musculoskeletal:     Cervical back: Neck supple.  Skin:    General: Skin is warm.  Neurological:     Mental Status: She is alert.  Psychiatric:        Mood and Affect: Mood normal.      Labs on Admission: I have personally reviewed following labs and imaging studies  CBC: Recent Labs  Lab 05/05/23 1722  WBC 5.5  HGB 13.8  HCT 41.7  MCV 94.8  PLT 272   Basic Metabolic Panel: Recent Labs  Lab 05/05/23 1745  NA 139  K 3.2*  CL 96*  CO2 27  GLUCOSE 108*  BUN 32*  CREATININE 0.87  CALCIUM 10.2   GFR: Estimated Creatinine Clearance: 66 mL/min (by C-G formula based on SCr of 0.87 mg/dL). Liver Function Tests: Recent Labs  Lab 05/05/23 1745  AST 35  ALT 17  ALKPHOS 60  BILITOT 1.0  PROT 9.0*  ALBUMIN 4.4   Recent Labs  Lab 05/05/23 1745  LIPASE 36   No results for input(s): "AMMONIA" in the last 168 hours. Coagulation Profile: No results for input(s): "INR", "PROTIME" in the last 168 hours. Cardiac Enzymes: No results for input(s): "CKTOTAL", "CKMB", "CKMBINDEX", "TROPONINI", "TROPONINIHS" in the last 168 hours. BNP (last 3 results) No results for input(s): "BNP" in the last 8760 hours. HbA1C: No results for input(s): "HGBA1C" in the last 72 hours. CBG: No results for input(s): "GLUCAP" in the last 168 hours. Lipid Profile: No results for input(s): "CHOL", "HDL", "LDLCALC", "TRIG", "CHOLHDL", "LDLDIRECT" in the last 72  hours. Thyroid Function Tests: No results for input(s): "TSH", "T4TOTAL", "FREET4", "T3FREE", "THYROIDAB" in the last 72 hours. Anemia Panel: No results for input(s): "VITAMINB12", "FOLATE", "FERRITIN", "TIBC", "IRON", "RETICCTPCT" in the last 72 hours. Urine analysis: No results found for: "COLORURINE", "APPEARANCEUR", "LABSPEC", "PHURINE", "GLUCOSEU", "HGBUR", "BILIRUBINUR", "KETONESUR", "PROTEINUR", "UROBILINOGEN", "NITRITE", "LEUKOCYTESUR"  Radiological Exams on Admission: I have personally reviewed images DG Abdomen 1 View  Result Date: 05/05/2023 CLINICAL DATA:  Check gastric catheter placement EXAM: ABDOMEN - 1 VIEW COMPARISON:  None Available. FINDINGS: Gastric catheter is noted within the stomach. Contrast is noted within the kidneys consistent with the recent CT. No free air is noted. IMPRESSION: Gastric catheter within the stomach Electronically Signed  By: Alcide Clever M.D.   On: 05/05/2023 21:37   CT ABDOMEN PELVIS W CONTRAST  Result Date: 05/05/2023 CLINICAL DATA:  Abdominal pain with nausea and vomiting. Possible small bowel obstruction. EXAM: CT ABDOMEN AND PELVIS WITH CONTRAST TECHNIQUE: Multidetector CT imaging of the abdomen and pelvis was performed using the standard protocol following bolus administration of intravenous contrast. RADIATION DOSE REDUCTION: This exam was performed according to the departmental dose-optimization program which includes automated exposure control, adjustment of the mA and/or kV according to patient size and/or use of iterative reconstruction technique. CONTRAST:  80mL OMNIPAQUE IOHEXOL 300 MG/ML  SOLN COMPARISON:  None Available. FINDINGS: Lower chest: No acute abnormality. Hepatobiliary: No focal liver abnormality is seen. No gallstones, gallbladder wall thickening, or biliary dilatation. Pancreas: Unremarkable. No pancreatic ductal dilatation or surrounding inflammatory changes. Spleen: Normal in size without focal abnormality. Adrenals/Urinary Tract:  The adrenal glands are within normal limits. The kidneys enhance symmetrically. No renal calculus or hydronephrosis. The bladder is unremarkable. Stomach/Bowel: The stomach and small bowel are mildly distended with air-fluid levels. The small bowel measures up to 3.2 cm in caliber. A transition point is noted in the mid pelvis, axial image 50. The small bowel is decompressed distal to this point. No free air or pneumatosis. A normal appendix is noted in the right lower quadrant. Vascular/Lymphatic: No significant vascular findings are present. No enlarged abdominal or pelvic lymph nodes. Reproductive: Multiple calcified fibroids are noted in the uterus. No adnexal mass. Other: No abdominopelvic ascites. Musculoskeletal: Mild degenerative changes are present in the thoracolumbar spine. No acute osseous abnormality is seen. IMPRESSION: 1. Distended stomach and small bowel with air-fluid levels. A transition point is noted in the pelvis, compatible with small-bowel obstruction. Surgical consultation is recommended. 2. Fibroid uterus. Critical Value/emergent results were called by telephone at the time of interpretation on 05/05/2023 at 8:17 pm to provider [facility] , who verbally acknowledged these results. Electronically Signed   By: Thornell Sartorius M.D.   On: 05/05/2023 20:18       Assessment/Plan: Principal Problem:   SBO (small bowel obstruction) (HCC) Active Problems:   Hypokalemia  Assessment and Plan:  Small bowel obstruction Hypokalemia Patient reported abdominal pain, ongoing nausea vomiting since Tuesday 6/29 2024. Patient denies any previous history of abdominal surgery.  Unsure about the exact cause of small bowel obstruction at this point except mild hypokalemia. -In the ED CT abdomen pelvis obtained which showed distended stomach and small bowel with air-fluid level with a transition point noted at the pelvis compatible with a small bowel obstruction.  ED physician reached out to  general surgery on-call Dr. Derrell Lolling who recommended NG tube placement and transport patient to the [facility]. -Once patient has been transferred over at [facility], ID reached out to general surgery who recommended continue NG tube suction, small bowel obstruction protocol and ordered SBO Gastrografin study. -Keeping patient n.p.o. - Continue LR 75 cc/h with Kcl 20 mill equivalent -Continue NG tube intermittent suction  -Will follow-up with general surgery plan in a.m. appreciate recommendation - Replating potassium - Checking mag and Phos level.  Continue to replete electrolyte to besilate resolution of SBO   Heartburn - Patient denies any previous history of GERD. - Continue Protonix 40 mg IV daily  Non-anion gap metabolic acidosis -NAGMA secondary from in the setting of nausea vomiting - Bicarb 27 and slightly better and gap 16.  Continue to monitor - Continue IV fluid.  DVT prophylaxis: Holding pharmacological prophylaxis in case  patient need any surgical intervention.  Continue SCD Code Status: Full Code.  Discussed with patient.  She wishes to be resuscitated in the setting of adverse event Diet: Currently n.p.o. Family Communication: None Disposition Plan: Based on Gastrografin study follow-through will make further care plan and will follow-up with general surgery assessment.  If no need for surgery and is to be resolved patient can be discharged to home Consults: General surgery Admission status: Inpatient, Med-Surg  Severity of Illness: The appropriate patient status for this patient is INPATIENT. Inpatient status is judged to be reasonable and necessary in order to provide the required intensity of service to ensure the patient's safety. The patient's presenting symptoms, physical exam findings, and initial radiographic and laboratory data in the context of their chronic comorbidities is felt to place them at high risk for further clinical deterioration. Furthermore,  it is not anticipated that the patient will be medically stable for discharge from the hospital within 2 midnights of admission.   * I certify that at the point of admission it is my clinical judgment that the patient will require inpatient hospital care spanning beyond 2 midnights from the point of admission due to high intensity of service, high risk for further deterioration and high frequency of surveillance required.Marland Kitchen    Tereasa Coop MD [facility]  How to contact the [facility] Attending or Consulting provider 7A - 7P or covering provider during after hours 7P -7A, for this patient?   Check the care team in [facility] and look for a) attending/consulting [facility] provider listed and b) the [facility] team listed Log into [email or web address] and use [facility] universal password to access. If you do not have the password, please contact the hospital operator. Locate the [facility] provider you are looking for under [facility] and page to a number that you can be directly reached. If you still have difficulty reaching the provider, please page the [facility] (Director on Call) for the Hospitalists listed on amion for assistance.  05/06/2023, 12:23 AM

## 2023-05-05 NOTE — ED Notes (Signed)
Patient in CT

## 2023-05-05 NOTE — Progress Notes (Signed)
Plan of Care Note for accepted transfer   Patient: Foua Mantz MRN: [id number]   DOA: 05/05/2023  Facility requesting transfer: [facility] Requesting Provider: Tegeler MD Reason for transfer: SBO Facility course: 62 yo F with N/V.  CT at clinic showed SBO -> in to ED.  Transition point in pelvis.  EDP d/w Dr. Derrell Lolling: NGT admit to medicine, will see in consult.  Plan of care: The patient is accepted for admission to Med-surg  unit, at [facility].  [facility] will assume care on arrival to accepting facility. Until arrival, care as per EDP. However, [facility] available 24/7 for questions and assistance.  Nursing staff, please page [facility] Admits and Consults [phone]) as soon as the patient arrives to the hospital.     Author: Hillary Bow., DO 05/05/2023  Check [email or web address] for on-call coverage.  Nursing staff, Please call [facility] Admits & Consults System-Wide number on Amion as soon as patient's arrival, so appropriate admitting provider can evaluate the pt.

## 2023-05-05 NOTE — ED Triage Notes (Signed)
Patient here POV from Home.  Endorses ABD Pain that began Saturday. Has since decreased but still endorses N/V. No Diarrhea. No Fevers.  Was at a Clinic and had a CT ABD which showed a SBO. Sent for Assessment.   NAD Noted during Triage. A&Ox4. GCS 15. Ambulatory.

## 2023-05-05 NOTE — Progress Notes (Signed)
Pt arrived to unit with NGT. She is AAOX4. Call bell is placed within reach and bed is in lowest position. Admits paged to notify that patient has arrived.

## 2023-05-05 NOTE — ED Provider Notes (Signed)
[facility] EMERGENCY DEPARTMENT AT [facility] Provider Note   CSN: [id number] Arrival date & time: 05/05/23  1708     History  Chief Complaint  Patient presents with   Vomiting    Sharon Berry is a 62 y.o. female who came in stating that was seen at [facility] and CT scan showed SBO. Pt instructed to come in. Pt has had significant crampy abdominal pain N/V since Saturday. Vomiting was clear and then became bilious. She rates her pain a 8-9 in severity. Has not been able to keep food down.   Past Medical History:  Diagnosis Date   Allergy    Arthritis    Depression    Hyperlipidemia       Home Medications Prior to Admission medications   Medication Sig Start Date End Date Taking? Authorizing Provider  Cholecalciferol (VITAMIN D PO) Take 5,000 Units by mouth daily.    [provider]  TURMERIC PO Take by mouth daily.    [provider]      Allergies    Spinach    Review of Systems   Review of Systems  Constitutional:  Positive for appetite change, chills and fatigue. Negative for fever.  Respiratory:  Negative for chest tightness and shortness of breath.   Gastrointestinal:  Positive for abdominal distention, abdominal pain, nausea and vomiting. Negative for blood in stool, constipation and diarrhea.  Genitourinary:  Negative for difficulty urinating, dysuria and urgency.    Physical Exam Updated Vital Signs BP (!) 143/87   Pulse 70   Temp 98 F (36.7 C)   Resp 16   Ht 5\' 7"  (1.702 m)   Wt 63.5 kg   SpO2 99%   BMI 21.93 kg/m  Physical Exam Cardiovascular:     Rate and Rhythm: Normal rate.     Heart sounds: No murmur heard.    No friction rub. No gallop.  Pulmonary:     Effort: No respiratory distress.  Abdominal:     General: There is no distension.     Palpations: There is no mass.     Tenderness: There is abdominal tenderness in the right lower quadrant and left lower quadrant. There is no guarding or  rebound. Negative signs include Murphy's sign, Rovsing's sign and McBurney's sign.  Neurological:     Mental Status: She is alert.    ED Results / Procedures / Treatments   Labs (all labs ordered are listed, but only abnormal results are displayed) Labs Reviewed  COMPREHENSIVE METABOLIC PANEL - Abnormal; Notable for the following components:      Result Value   Potassium 3.2 (*)    Chloride 96 (*)    Glucose, Bld 108 (*)    BUN 32 (*)    Total Protein 9.0 (*)    Anion gap 16 (*)    All other components within normal limits  CBC  LIPASE, BLOOD  LACTIC ACID, PLASMA  URINALYSIS, ROUTINE W REFLEX MICROSCOPIC   Radiology CT ABDOMEN PELVIS W CONTRAST  Result Date: 05/05/2023 CLINICAL DATA:  Abdominal pain with nausea and vomiting. Possible small bowel obstruction. EXAM: CT ABDOMEN AND PELVIS WITH CONTRAST TECHNIQUE: Multidetector CT imaging of the abdomen and pelvis was performed using the standard protocol following bolus administration of intravenous contrast. RADIATION DOSE REDUCTION: This exam was performed according to the departmental dose-optimization program which includes automated exposure control, adjustment of the mA and/or kV according to patient size and/or use of iterative reconstruction technique. CONTRAST:  80mL  OMNIPAQUE IOHEXOL 300 MG/ML  SOLN COMPARISON:  None Available. FINDINGS: Lower chest: No acute abnormality. Hepatobiliary: No focal liver abnormality is seen. No gallstones, gallbladder wall thickening, or biliary dilatation. Pancreas: Unremarkable. No pancreatic ductal dilatation or surrounding inflammatory changes. Spleen: Normal in size without focal abnormality. Adrenals/Urinary Tract: The adrenal glands are within normal limits. The kidneys enhance symmetrically. No renal calculus or hydronephrosis. The bladder is unremarkable. Stomach/Bowel: The stomach and small bowel are mildly distended with air-fluid levels. The small bowel measures up to 3.2 cm in caliber. A  transition point is noted in the mid pelvis, axial image 50. The small bowel is decompressed distal to this point. No free air or pneumatosis. A normal appendix is noted in the right lower quadrant. Vascular/Lymphatic: No significant vascular findings are present. No enlarged abdominal or pelvic lymph nodes. Reproductive: Multiple calcified fibroids are noted in the uterus. No adnexal mass. Other: No abdominopelvic ascites. Musculoskeletal: Mild degenerative changes are present in the thoracolumbar spine. No acute osseous abnormality is seen. IMPRESSION: 1. Distended stomach and small bowel with air-fluid levels. A transition point is noted in the pelvis, compatible with small-bowel obstruction. Surgical consultation is recommended. 2. Fibroid uterus. Critical Value/emergent results were called by telephone at the time of interpretation on 05/05/2023 at 8:17 pm to provider [facility] , who verbally acknowledged these results. Electronically Signed   By: Thornell Sartorius M.D.   On: 05/05/2023 20:18    Procedures Procedures   NG tube placed.  Medications Ordered in ED Medications  sodium chloride 0.9 % bolus 1,000 mL (0 mLs Intravenous Stopped 05/05/23 2027)  morphine (PF) 4 MG/ML injection 4 mg (4 mg Intravenous Given 05/05/23 2007)  iohexol (OMNIPAQUE) 300 MG/ML solution 100 mL (80 mLs Intravenous Contrast Given 05/05/23 1953)  ondansetron (ZOFRAN) injection 4 mg (4 mg Intravenous Given 05/05/23 2010)  lidocaine (XYLOCAINE) 2 % jelly 1 Application (1 Application Other Given 05/05/23 2113)  Benzocaine (HURRCAINE) 20 % mouth spray ( Mouth/Throat Given 05/05/23 2113)    ED Course/ Medical Decision Making/ A&P                           Medical Decision Making Amount and/or Complexity of Data Reviewed Labs: ordered. Radiology: ordered.  Risk Prescription drug management. Decision regarding hospitalization.  Ms. Fate is a 62yo female with a PMH of hyperlipidemia, comes in with complaint of crampy  abdominal pain and reporting she got a CT scan at [facility] that showed a partial bowel obstruction. CT scan consistent with total bowel obstruction. Surgery (Dr. Derrell Lolling) consulted and recommended admission to medical floor for observation. NG tube placed for decompression.  Final Clinical Impression(s) / ED Diagnoses Final diagnoses:  Small bowel obstruction [facility])    Rx / DC Orders ED Discharge Orders     None         Hassan Rowan, [place], MD 05/05/23 2301    Tegeler, Canary Brim, MD 05/07/23 2135

## 2023-05-06 ENCOUNTER — Inpatient Hospital Stay (HOSPITAL_COMMUNITY): Payer: Medicaid Other

## 2023-05-06 ENCOUNTER — Encounter (HOSPITAL_COMMUNITY): Payer: Self-pay | Admitting: Internal Medicine

## 2023-05-06 DIAGNOSIS — K56609 Unspecified intestinal obstruction, unspecified as to partial versus complete obstruction: Secondary | ICD-10-CM | POA: Diagnosis not present

## 2023-05-06 DIAGNOSIS — K6389 Other specified diseases of intestine: Secondary | ICD-10-CM | POA: Diagnosis not present

## 2023-05-06 DIAGNOSIS — K5651 Intestinal adhesions [bands], with partial obstruction: Secondary | ICD-10-CM | POA: Diagnosis not present

## 2023-05-06 LAB — COMPREHENSIVE METABOLIC PANEL
ALT: 21 U/L (ref 0–44)
AST: 37 U/L (ref 15–41)
Albumin: 3.1 g/dL — ABNORMAL LOW (ref 3.5–5.0)
Alkaline Phosphatase: 51 U/L (ref 38–126)
Anion gap: 19 — ABNORMAL HIGH (ref 5–15)
BUN: 23 mg/dL (ref 8–23)
CO2: 23 mmol/L (ref 22–32)
Calcium: 8.7 mg/dL — ABNORMAL LOW (ref 8.9–10.3)
Chloride: 100 mmol/L (ref 98–111)
Creatinine, Ser: 0.8 mg/dL (ref 0.44–1.00)
GFR, Estimated: >60 mL/min (ref >60)
Glucose, Bld: 83 mg/dL (ref 70–99)
Potassium: 3.1 mmol/L — ABNORMAL LOW (ref 3.5–5.1)
Sodium: 142 mmol/L (ref 135–145)
Total Bilirubin: 1.1 mg/dL (ref 0.3–1.2)
Total Protein: 7.7 g/dL (ref 6.5–8.1)

## 2023-05-06 LAB — CBC
HCT: 38.1 % (ref 36.0–46.0)
Hemoglobin: 12.1 g/dL (ref 12.0–15.0)
MCH: 29.7 pg (ref 26.0–34.0)
MCHC: 31.8 g/dL (ref 30.0–36.0)
MCV: 93.6 fL (ref 80.0–100.0)
Platelets: 244 10*3/uL (ref 150–400)
RBC: 4.07 MIL/uL (ref 3.87–5.11)
RDW: 13.2 % (ref 11.5–15.5)
WBC: 7.5 10*3/uL (ref 4.0–10.5)
nRBC: 0 % (ref 0.0–0.2)

## 2023-05-06 LAB — MAGNESIUM: Magnesium: 2.3 mg/dL (ref 1.7–2.4)

## 2023-05-06 LAB — PHOSPHORUS: Phosphorus: 3.6 mg/dL (ref 2.5–4.6)

## 2023-05-06 LAB — HIV ANTIBODY (ROUTINE TESTING W REFLEX): HIV Screen 4th Generation wRfx: NONREACTIVE

## 2023-05-06 MED ORDER — POTASSIUM CHLORIDE 10 MEQ/100ML IV SOLN
10.0000 meq | INTRAVENOUS | Status: AC
  Administered 2023-05-06 (×4): 10 meq via INTRAVENOUS
  Filled 2023-05-06 (×4): qty 100

## 2023-05-06 MED ORDER — PHENOL 1.4 % MT LIQD
1.0000 | OROMUCOSAL | Status: DC | PRN
  Administered 2023-05-06: 1 via OROMUCOSAL
  Filled 2023-05-06: qty 177

## 2023-05-06 MED ORDER — KCL IN DEXTROSE-NACL 40-5-0.9 MEQ/L-%-% IV SOLN
INTRAVENOUS | Status: DC
  Filled 2023-05-06 (×12): qty 1000

## 2023-05-06 NOTE — Progress Notes (Signed)
   Sharon Berry  UJW:[id number] DOB: 1961-11-01 DOA: 05/05/2023 PCP: Carmel Sacramento, NP    Brief Narrative:  62 year old with a history of HLD who presented to [facility] 7 3/24 with complaints of generalized abdominal pain nausea and vomiting which have been worsening since 05/01/2023.  In the ER CT abdomen/pelvis noted distended stomach and evidence of an SBO with a transition point in the pelvis.  NG tube was placed and patient reported significant symptomatic improvement afterwards.  Goals of Care:  Code Status: Full Code   DVT prophylaxis: Lovenox  Interim Hx: Afebrile since admission.  Vital signs stable.  Feels much better since NG tube placed.  Not yet passing gas or moving bowels.  Denies abdominal pain or shortness of breath at time of visit.  Assessment & Plan:  Small bowel obstruction Continue NG suction and conservative management for now - General Surgery following with [place]  Hypokalemia Supplement to goal of 4.0 -magnesium is normal   Family Communication: Spoke with spouse at bedside Disposition: Eventual discharge home   Objective: Blood pressure (!) 145/75, pulse 77, temperature 98.4 F (36.9 C), temperature source Oral, resp. rate 18, height 5\' 7"  (1.702 m), weight 65.6 kg, SpO2 99 %.  Intake/Output Summary (Last 24 hours) at 05/06/2023 1031 Last data filed at 05/06/2023 0700 Gross per 24 hour  Intake 1000 ml  Output 500 ml  Net 500 ml   Filed Weights   05/05/23 1715 05/05/23 2232  Weight: 63.5 kg 65.6 kg    Examination: General: No acute respiratory distress Lungs: Clear to auscultation bilaterally without wheezes or crackles Cardiovascular: Regular rate and rhythm without murmur gallop or rub normal S1 and S2 Abdomen: Soft, bowel sounds absent, no rebound, no mass Extremities: No significant cyanosis, clubbing, or edema bilateral lower extremities  CBC: Recent Labs  Lab 05/05/23 1722 05/06/23 0207  WBC 5.5 7.5  HGB 13.8 12.1  HCT  41.7 38.1  MCV 94.8 93.6  PLT 272 244   Basic Metabolic Panel: Recent Labs  Lab 05/05/23 1745 05/06/23 0207  NA 139 142  K 3.2* 3.1*  CL 96* 100  CO2 27 23  GLUCOSE 108* 83  BUN 32* 23  CREATININE 0.87 0.80  CALCIUM 10.2 8.7*  MG  --  2.3  PHOS  --  3.6   GFR: Estimated Creatinine Clearance: 71.8 mL/min (by C-G formula based on SCr of 0.8 mg/dL).   Scheduled Meds:  pantoprazole (PROTONIX) IV  40 mg Intravenous Q24H   sodium chloride flush  3 mL Intravenous Q12H   Continuous Infusions:  sodium chloride     dextrose 5 % and 0.9 % NaCl with KCl 40 mEq/L 125 mL/hr at 05/06/23 0742     LOS: 1 day   Lonia Blood, MD [facility] Office  [phone] Pager - Text Page per Loretha Stapler  If 7PM-7AM, please contact night-coverage per Amion 05/06/2023, 10:31 AM

## 2023-05-06 NOTE — Consult Note (Signed)
Reason for Consult: SBO Referring Physician: Dr. Ysidro Evert Karesse Sharon Berry is an 62 y.o. female.  HPI: Patient is a 62 year old female comes in today for abdominal pain, SBO.  Patient states that pain nausea vomiting began Saturday prior to mission.  She states that she had been seen at her PCPs office and underwent x-ray.  Subsequently she had continued abdominal pain nausea vomiting several days later.  She was sent to the emergency room overnight.  Upon evaluation the emergency room she underwent CT scan.  This was significant for SBO.  Patient was transferred from outside urgent care to [facility] for admission and further evaluation and management.  Patient denies any previous abdominal surgery.  Patient has NG tube in place currently.  She does state that she feels better.  She states that her last bowel movement was Tuesday.  Past Medical History:  Diagnosis Date   Allergy    Arthritis    Depression    Hyperlipidemia     History reviewed. No pertinent surgical history.  Family History  Problem Relation Age of Onset   Arthritis Mother    Hearing loss Mother    Hypertension Mother    Arthritis Father    Alcohol abuse Father    Cancer Father    Arthritis Sister    Hypertension Sister    COPD Sister    Cancer Sister    Hypertension Brother    Hyperlipidemia Brother    Diabetes Brother    Hypertension Maternal Grandfather    Hyperlipidemia Maternal Grandfather    Stroke Maternal Grandfather     Social History:  reports that she has never smoked. She has never been exposed to tobacco smoke. She has never used smokeless tobacco. She reports current alcohol use. She reports that she does not use drugs.  Allergies:  Allergies  Allergen Reactions   Spinach     Medications: I have reviewed the patient's current medications.  Results for orders placed or performed during the hospital encounter of 05/05/23 (from the past 48 hour(s))  CBC     Status: None   Collection  Time: 05/05/23  5:22 PM  Result Value Ref Range   WBC 5.5 4.0 - 10.5 K/uL   RBC 4.40 3.87 - 5.11 MIL/uL   Hemoglobin 13.8 12.0 - 15.0 g/dL   HCT 86.5 78.4 - 69.6 %   MCV 94.8 80.0 - 100.0 fL   MCH 31.4 26.0 - 34.0 pg   MCHC 33.1 30.0 - 36.0 g/dL   RDW 29.5 28.4 - 13.2 %   Platelets 272 150 - 400 K/uL   nRBC 0.0 0.0 - 0.2 %    Comment: Performed at [facility], [address] [place], [place] [address]  Comprehensive metabolic panel     Status: Abnormal   Collection Time: 05/05/23  5:45 PM  Result Value Ref Range   Sodium 139 135 - 145 mmol/L   Potassium 3.2 (L) 3.5 - 5.1 mmol/L   Chloride 96 (L) 98 - 111 mmol/L   CO2 27 22 - 32 mmol/L   Glucose, Bld 108 (H) 70 - 99 mg/dL    Comment: Glucose reference range applies only to samples taken after fasting for at least 8 hours.   BUN 32 (H) 8 - 23 mg/dL   Creatinine, Ser 2.72 0.44 - 1.00 mg/dL   Calcium 53.6 8.9 - 64.4 mg/dL   Total Protein 9.0 (H) 6.5 - 8.1 g/dL   Albumin 4.4 3.5 - 5.0 g/dL  AST 35 15 - 41 U/L   ALT 17 0 - 44 U/L   Alkaline Phosphatase 60 38 - 126 U/L   Total Bilirubin 1.0 0.3 - 1.2 mg/dL   GFR, Estimated >40 >98 mL/min    Comment: (NOTE) Calculated using the CKD-EPI Creatinine Equation (2021)    Anion gap 16 (H) 5 - 15    Comment: Performed at [facility], [address], [place], [place] [address]  Lipase, blood     Status: None   Collection Time: 05/05/23  5:45 PM  Result Value Ref Range   Lipase 36 11 - 51 U/L    Comment: Performed at [facility], [address] [place], [place] [address]  Lactic acid, plasma     Status: None   Collection Time: 05/05/23  6:13 PM  Result Value Ref Range   Lactic Acid, Venous 1.3 0.5 - 1.9 mmol/L    Comment: Performed at [facility], [address] [place], [place] [address]  Magnesium     Status: None   Collection Time: 05/06/23  2:07 AM  Result Value Ref Range   Magnesium 2.3 1.7 -  2.4 mg/dL    Comment: Performed at [facility], [address] [place], [place] [address]  Phosphorus     Status: None   Collection Time: 05/06/23  2:07 AM  Result Value Ref Range   Phosphorus 3.6 2.5 - 4.6 mg/dL    Comment: Performed at [facility], [address], [place], [place] [address]  Comprehensive metabolic panel     Status: Abnormal   Collection Time: 05/06/23  2:07 AM  Result Value Ref Range   Sodium 142 135 - 145 mmol/L   Potassium 3.1 (L) 3.5 - 5.1 mmol/L   Chloride 100 98 - 111 mmol/L   CO2 23 22 - 32 mmol/L   Glucose, Bld 83 70 - 99 mg/dL    Comment: Glucose reference range applies only to samples taken after fasting for at least 8 hours.   BUN 23 8 - 23 mg/dL   Creatinine, Ser 9.52 0.44 - 1.00 mg/dL   Calcium 8.7 (L) 8.9 - 10.3 mg/dL   Total Protein 7.7 6.5 - 8.1 g/dL   Albumin 3.1 (L) 3.5 - 5.0 g/dL   AST 37 15 - 41 U/L   ALT 21 0 - 44 U/L   Alkaline Phosphatase 51 38 - 126 U/L   Total Bilirubin 1.1 0.3 - 1.2 mg/dL   GFR, Estimated >84 >13 mL/min    Comment: (NOTE) Calculated using the CKD-EPI Creatinine Equation (2021)    Anion gap 19 (H) 5 - 15    Comment: Performed at [facility], [address], [place], [place] [address]  CBC     Status: None   Collection Time: 05/06/23  2:07 AM  Result Value Ref Range   WBC 7.5 4.0 - 10.5 K/uL   RBC 4.07 3.87 - 5.11 MIL/uL   Hemoglobin 12.1 12.0 - 15.0 g/dL   HCT 02.7 25.3 - 66.4 %   MCV 93.6 80.0 - 100.0 fL   MCH 29.7 26.0 - 34.0 pg   MCHC 31.8 30.0 - 36.0 g/dL   RDW 40.3 47.4 - 25.9 %   Platelets 244 150 - 400 K/uL   nRBC 0.0 0.0 - 0.2 %    Comment: Performed at [facility], [address], [place], [place] [address]    DG Abd Portable 1V-Small Bowel Protocol-Position  Verification  Result Date: 05/06/2023 CLINICAL DATA:  Check gastric catheter placement EXAM: PORTABLE ABDOMEN - 1 VIEW COMPARISON:  Film from earlier in the same day. FINDINGS: Gastric catheter is again seen in the  stomach. No obstructive changes are noted. IMPRESSION: Gastric catheter within the stomach. Electronically Signed   By: Alcide Clever M.D.   On: 05/06/2023 00:22   DG Abdomen 1 View  Result Date: 05/05/2023 CLINICAL DATA:  Check gastric catheter placement EXAM: ABDOMEN - 1 VIEW COMPARISON:  None Available. FINDINGS: Gastric catheter is noted within the stomach. Contrast is noted within the kidneys consistent with the recent CT. No free air is noted. IMPRESSION: Gastric catheter within the stomach Electronically Signed   By: Alcide Clever M.D.   On: 05/05/2023 21:37   CT ABDOMEN PELVIS W CONTRAST  Result Date: 05/05/2023 CLINICAL DATA:  Abdominal pain with nausea and vomiting. Possible small bowel obstruction. EXAM: CT ABDOMEN AND PELVIS WITH CONTRAST TECHNIQUE: Multidetector CT imaging of the abdomen and pelvis was performed using the standard protocol following bolus administration of intravenous contrast. RADIATION DOSE REDUCTION: This exam was performed according to the departmental dose-optimization program which includes automated exposure control, adjustment of the mA and/or kV according to patient size and/or use of iterative reconstruction technique. CONTRAST:  80mL OMNIPAQUE IOHEXOL 300 MG/ML  SOLN COMPARISON:  None Available. FINDINGS: Lower chest: No acute abnormality. Hepatobiliary: No focal liver abnormality is seen. No gallstones, gallbladder wall thickening, or biliary dilatation. Pancreas: Unremarkable. No pancreatic ductal dilatation or surrounding inflammatory changes. Spleen: Normal in size without focal abnormality. Adrenals/Urinary Tract: The adrenal glands are within normal limits. The kidneys enhance symmetrically. No renal calculus or hydronephrosis. The bladder is unremarkable. Stomach/Bowel: The stomach and small bowel are mildly distended with air-fluid levels. The small bowel measures up to 3.2 cm in caliber. A transition point is noted in the mid pelvis, axial image 50. The small  bowel is decompressed distal to this point. No free air or pneumatosis. A normal appendix is noted in the right lower quadrant. Vascular/Lymphatic: No significant vascular findings are present. No enlarged abdominal or pelvic lymph nodes. Reproductive: Multiple calcified fibroids are noted in the uterus. No adnexal mass. Other: No abdominopelvic ascites. Musculoskeletal: Mild degenerative changes are present in the thoracolumbar spine. No acute osseous abnormality is seen. IMPRESSION: 1. Distended stomach and small bowel with air-fluid levels. A transition point is noted in the pelvis, compatible with small-bowel obstruction. Surgical consultation is recommended. 2. Fibroid uterus. Critical Value/emergent results were called by telephone at the time of interpretation on 05/05/2023 at 8:17 pm to provider [facility] , who verbally acknowledged these results. Electronically Signed   By: Thornell Sartorius M.D.   On: 05/05/2023 20:18    Review of Systems  Constitutional:  Negative for chills and fever.  HENT:  Negative for ear discharge, hearing loss and sore throat.   Eyes:  Negative for discharge.  Respiratory:  Negative for cough and shortness of breath.   Cardiovascular:  Negative for chest pain and leg swelling.  Gastrointestinal:  Positive for abdominal pain, nausea and vomiting. Negative for constipation and diarrhea.  Musculoskeletal:  Negative for myalgias and neck pain.  Skin:  Negative for rash.  Allergic/Immunologic: Negative for environmental allergies.  Neurological:  Negative for dizziness and seizures.  Hematological:  Does not bruise/bleed easily.  Psychiatric/Behavioral:  Negative for suicidal ideas.   All other systems reviewed and are negative.  Blood pressure 136/81, pulse 74, temperature 98.2 F (36.8 C), temperature source  Oral, resp. rate 17, height 5\' 7"  (1.702 m), weight 65.6 kg, SpO2 98 %. Physical Exam Constitutional:      Appearance: She is well-developed.      Comments: Conversant No acute distress  HENT:     Head: Normocephalic and atraumatic.  Eyes:     General: Lids are normal. No scleral icterus.    Pupils: Pupils are equal, round, and reactive to light.     Comments: Pupils are equal round and reactive No lid lag Moist conjunctiva  Neck:     Thyroid: No thyromegaly.     Trachea: No tracheal tenderness.     Comments: No cervical lymphadenopathy Cardiovascular:     Rate and Rhythm: Normal rate and regular rhythm.     Heart sounds: No murmur heard. Pulmonary:     Effort: Pulmonary effort is normal.     Breath sounds: Normal breath sounds. No wheezing or rales.  Abdominal:     General: Bowel sounds are decreased.     Palpations: Abdomen is soft.     Tenderness: There is no abdominal tenderness. There is no guarding or rebound.     Hernia: No hernia is present.  Musculoskeletal:     Cervical back: Normal range of motion and neck supple.  Skin:    General: Skin is warm.     Findings: No rash.     Nails: There is no clubbing.     Comments: Normal skin turgor  Neurological:     Mental Status: She is alert and oriented to person, place, and time.     Comments: Normal gait and station  Psychiatric:        Mood and Affect: Mood normal.        Thought Content: Thought content normal.        Judgment: Judgment normal.     Comments: Appropriate affect     Assessment/Plan: 62 year old female with SBO. 1.  At this time we will continue with SBO protocol. Continue NG tube I discussed with the patient that if SBO does not resolve in the next 24 to 48 hours she may require surgery. 2. Will continue to follow patient with you.  Axel Filler 05/06/2023, 6:41 AM

## 2023-05-07 ENCOUNTER — Inpatient Hospital Stay (HOSPITAL_COMMUNITY): Payer: Medicaid Other

## 2023-05-07 DIAGNOSIS — Z4682 Encounter for fitting and adjustment of non-vascular catheter: Secondary | ICD-10-CM | POA: Diagnosis not present

## 2023-05-07 DIAGNOSIS — R140 Abdominal distension (gaseous): Secondary | ICD-10-CM | POA: Diagnosis not present

## 2023-05-07 DIAGNOSIS — K5651 Intestinal adhesions [bands], with partial obstruction: Secondary | ICD-10-CM | POA: Diagnosis not present

## 2023-05-07 DIAGNOSIS — K56609 Unspecified intestinal obstruction, unspecified as to partial versus complete obstruction: Secondary | ICD-10-CM | POA: Diagnosis not present

## 2023-05-07 LAB — COMPREHENSIVE METABOLIC PANEL
ALT: 18 U/L (ref 0–44)
AST: 24 U/L (ref 15–41)
Albumin: 3 g/dL — ABNORMAL LOW (ref 3.5–5.0)
Alkaline Phosphatase: 52 U/L (ref 38–126)
Anion gap: 8 (ref 5–15)
BUN: 11 mg/dL (ref 8–23)
CO2: 22 mmol/L (ref 22–32)
Calcium: 8.7 mg/dL — ABNORMAL LOW (ref 8.9–10.3)
Chloride: 113 mmol/L — ABNORMAL HIGH (ref 98–111)
Creatinine, Ser: 0.69 mg/dL (ref 0.44–1.00)
GFR, Estimated: >60 mL/min (ref >60)
Glucose, Bld: 124 mg/dL — ABNORMAL HIGH (ref 70–99)
Potassium: 4.2 mmol/L (ref 3.5–5.1)
Sodium: 143 mmol/L (ref 135–145)
Total Bilirubin: 1 mg/dL (ref 0.3–1.2)
Total Protein: 7.2 g/dL (ref 6.5–8.1)

## 2023-05-07 LAB — CBC
HCT: 36.6 % (ref 36.0–46.0)
Hemoglobin: 11.9 g/dL — ABNORMAL LOW (ref 12.0–15.0)
MCH: 31.5 pg (ref 26.0–34.0)
MCHC: 32.5 g/dL (ref 30.0–36.0)
MCV: 96.8 fL (ref 80.0–100.0)
Platelets: 223 10*3/uL (ref 150–400)
RBC: 3.78 MIL/uL — ABNORMAL LOW (ref 3.87–5.11)
RDW: 13.6 % (ref 11.5–15.5)
WBC: 7.9 10*3/uL (ref 4.0–10.5)
nRBC: 0 % (ref 0.0–0.2)

## 2023-05-07 LAB — MAGNESIUM: Magnesium: 2.2 mg/dL (ref 1.7–2.4)

## 2023-05-07 LAB — PHOSPHORUS: Phosphorus: 2.2 mg/dL — ABNORMAL LOW (ref 2.5–4.6)

## 2023-05-07 MED ORDER — SODIUM PHOSPHATES 45 MMOLE/15ML IV SOLN
20.0000 mmol | Freq: Once | INTRAVENOUS | Status: AC
  Administered 2023-05-07: 20 mmol via INTRAVENOUS
  Filled 2023-05-07: qty 6.67

## 2023-05-07 NOTE — Progress Notes (Signed)
   Sharon Berry  ZOX:[id number] DOB: 1961/03/16 DOA: 05/05/2023 PCP: Carmel Sacramento, NP    Brief Narrative:  62 year old with a history of HLD who presented to [facility] 7 3/24 with complaints of generalized abdominal pain nausea and vomiting which have been worsening since 05/01/2023.  In the ER CT abdomen/pelvis noted distended stomach and evidence of an SBO with a transition point in the pelvis.  NG tube was placed and patient reported significant symptomatic improvement afterwards.  Goals of Care:  Code Status: Full Code   DVT prophylaxis: Lovenox  Interim Hx: No acute events recorded overnight.  Afebrile.  Vital signs stable.  Electrolytes at goal.  KUB this a.m. notes improvement but not complete resolution of SBO, but patient not passing gas or having a bowel movement yet. No new complaints today.   Assessment & Plan:  Small bowel obstruction Continue NG suction and conservative management for now - General Surgery following with [place]  Hypokalemia Supplement to goal of 4.0 -magnesium is normal   Family Communication:  Disposition: Eventual discharge home   Objective: Blood pressure 134/81, pulse 83, temperature 98.1 F (36.7 C), temperature source Oral, resp. rate 18, height 5\' 7"  (1.702 m), weight 65.6 kg, SpO2 99 %.  Intake/Output Summary (Last 24 hours) at 05/07/2023 1016 Last data filed at 05/07/2023 0700 Gross per 24 hour  Intake --  Output 1550 ml  Net -1550 ml    Filed Weights   05/05/23 1715 05/05/23 2232  Weight: 63.5 kg 65.6 kg    Examination: General: No acute respiratory distress  CBC: Recent Labs  Lab 05/05/23 1722 05/06/23 0207 05/07/23 0759  WBC 5.5 7.5 7.9  HGB 13.8 12.1 11.9*  HCT 41.7 38.1 36.6  MCV 94.8 93.6 96.8  PLT 272 244 223    Basic Metabolic Panel: Recent Labs  Lab 05/05/23 1745 05/06/23 0207 05/07/23 0759  NA 139 142 143  K 3.2* 3.1* 4.2  CL 96* 100 113*  CO2 27 23 22   GLUCOSE 108* 83 124*  BUN 32* 23 11   CREATININE 0.87 0.80 0.69  CALCIUM 10.2 8.7* 8.7*  MG  --  2.3 2.2  PHOS  --  3.6 2.2*    GFR: Estimated Creatinine Clearance: 71.8 mL/min (by C-G formula based on SCr of 0.69 mg/dL).   Scheduled Meds:  pantoprazole (PROTONIX) IV  40 mg Intravenous Q24H   sodium chloride flush  3 mL Intravenous Q12H   Continuous Infusions:  sodium chloride     dextrose 5 % and 0.9 % NaCl with KCl 40 mEq/L 75 mL/hr at 05/06/23 2236     LOS: 2 days   Lonia Blood, MD [facility] Office  [phone] Pager - Text Page per Loretha Stapler  If 7PM-7AM, please contact night-coverage per Amion 05/07/2023, 10:16 AM

## 2023-05-07 NOTE — TOC Initial Note (Addendum)
Transition of Care [facility]) - Initial/Assessment Note    Patient Details  Name: Sharon Berry MRN: [id number] Date of Birth: April 22, 1961  Transition of Care [facility]) CM/SW Contact:    Mearl Latin, LCSW Phone Number: 05/07/2023, 5:44 PM  Clinical Narrative:                 Patient admitted from home. Currently with NG tube and surgery following. Please place [facility] consult if needs arise.    Barriers to Discharge: Continued Medical Work up   Patient Goals and CMS Choice            Expected Discharge Plan and Services       Living arrangements for the past 2 months: Single Family Home                                      Prior Living Arrangements/Services Living arrangements for the past 2 months: Single Family Home Lives with:: Spouse Patient language and need for interpreter reviewed:: Yes        Need for Family Participation in Patient Care: Yes (Comment) Care giver support system in place?: Yes (comment)   Criminal Activity/Legal Involvement Pertinent to Current Situation/Hospitalization: No - Comment as needed  Activities of Daily Living      Permission Sought/Granted                  Emotional Assessment       Orientation: : Oriented to Self, Oriented to Place, Oriented to  Time, Oriented to Situation Alcohol / Substance Use: Not Applicable Psych Involvement: No (comment)  Admission diagnosis:  Small bowel obstruction (HCC) [K56.609] SBO (small bowel obstruction) (HCC) [K56.609] Patient Active Problem List   Diagnosis Date Noted   SBO (small bowel obstruction) (HCC) 05/05/2023   Hypokalemia 05/05/2023   PCP:  Carmel Sacramento, NP Pharmacy:   [id number] - [place], [place] - [address] AT [facility] OF [address] & [address] [place] [address] Phone: [phone] Fax: [phone]     Social Determinants of Health (SDOH) Social History: SDOH Screenings   Food Insecurity: No Food  Insecurity (05/06/2023)  Transportation Needs: No Transportation Needs (05/06/2023)  Tobacco Use: Low Risk  (05/06/2023)   SDOH Interventions:     Readmission Risk Interventions     No data to display

## 2023-05-07 NOTE — Progress Notes (Signed)
[facility] Progress Note     Subjective: CC:  States she feels better, less pain. Denies flatus or BM. Confirms she has never had abdominal sugery, states she has had a transvaginal procedure for bleeding uterine fibroids.   Objective: Vital signs in last 24 hours: Temp:  [98.1 F (36.7 C)-98.7 F (37.1 C)] 98.1 F (36.7 C) (07/05 0816) Pulse Rate:  [73-83] 83 (07/05 0816) Resp:  [17-20] 18 (07/05 0816) BP: (133-142)/(73-81) 134/81 (07/05 0816) SpO2:  [97 %-100 %] 99 % (07/05 0816)    Intake/Output from previous day: 07/04 0701 - 07/05 0700 In: -  Out: 1550 [Emesis/NG output:1550] Intake/Output this shift: No intake/output data recorded.  PE: Gen:  Alert, NAD, pleasant Card:  Regular rate and rhythm Pulm:  Normal effort ORA Abd: Soft, non-tender, mild distention, no guarding, no hernias, no scars on her abdomin  NG with think, bilious effluent in cannister, 1.5L Skin: warm and dry, no rashes  Psych: A&Ox3   Lab Results:  Recent Labs    05/06/23 0207 05/07/23 0759  WBC 7.5 7.9  HGB 12.1 11.9*  HCT 38.1 36.6  PLT 244 223   BMET Recent Labs    05/06/23 0207 05/07/23 0759  NA 142 143  K 3.1* 4.2  CL 100 113*  CO2 23 22  GLUCOSE 83 124*  BUN 23 11  CREATININE 0.80 0.69  CALCIUM 8.7* 8.7*   PT/INR No results for input(s): "LABPROT", "INR" in the last 72 hours. CMP     Component Value Date/Time   NA 143 05/07/2023 0759   K 4.2 05/07/2023 0759   CL 113 (H) 05/07/2023 0759   CO2 22 05/07/2023 0759   GLUCOSE 124 (H) 05/07/2023 0759   BUN 11 05/07/2023 0759   CREATININE 0.69 05/07/2023 0759   CALCIUM 8.7 (L) 05/07/2023 0759   PROT 7.2 05/07/2023 0759   ALBUMIN 3.0 (L) 05/07/2023 0759   AST 24 05/07/2023 0759   ALT 18 05/07/2023 0759   ALKPHOS 52 05/07/2023 0759   BILITOT 1.0 05/07/2023 0759   GFRNONAA >60 05/07/2023 0759   Lipase     Component Value Date/Time   LIPASE 36 05/05/2023 1745       Studies/Results: DG Abd  Portable 1V  Result Date: 05/07/2023 CLINICAL DATA:  Small bowel obstruction. EXAM: PORTABLE ABDOMEN - 1 VIEW COMPARISON:  05/06/2023 FINDINGS: NG tube tip is in the mid stomach. Interval slight decrease in gaseous small bowel dilatation although there are some persistent mildly distended gas-filled small bowel loops in the central abdomen measuring up to 3.4 cm diameter. Distended small bowel seen over the pelvis on the previous study has resolved. IMPRESSION: Interval slight decrease in gaseous small bowel dilatation although there are some persistent mildly distended gas-filled small bowel loops in the central abdomen. Electronically Signed   By: Kennith Center M.D.   On: 05/07/2023 08:50   DG Abd Portable 1V-Small Bowel Obstruction Protocol-initial, 8 hr delay  Result Date: 05/06/2023 CLINICAL DATA:  Small bowel obstruction EXAM: PORTABLE ABDOMEN - 1 VIEW COMPARISON:  Abdominal radiograph dated May 05, 2019 FINDINGS: NG tube tip and side port are in the proximal stomach. Numerous gas-filled dilated loops of small bowel. Contrast material seen in the stomach. Visualized lung bases are clear. IMPRESSION: 1. NG tube tip and side port are in the proximal stomach. 2. Numerous gas-filled dilated loops of small bowel, consistent with small bowel obstruction. Electronically Signed   By: Allegra Lai M.D.   On: 05/06/2023 10:19  DG Abd Portable 1V-Small Bowel Protocol-Position Verification  Result Date: 05/06/2023 CLINICAL DATA:  Check gastric catheter placement EXAM: PORTABLE ABDOMEN - 1 VIEW COMPARISON:  Film from earlier in the same day. FINDINGS: Gastric catheter is again seen in the stomach. No obstructive changes are noted. IMPRESSION: Gastric catheter within the stomach. Electronically Signed   By: Alcide Clever M.D.   On: 05/06/2023 00:22   DG Abdomen 1 View  Result Date: 05/05/2023 CLINICAL DATA:  Check gastric catheter placement EXAM: ABDOMEN - 1 VIEW COMPARISON:  None Available. FINDINGS: Gastric  catheter is noted within the stomach. Contrast is noted within the kidneys consistent with the recent CT. No free air is noted. IMPRESSION: Gastric catheter within the stomach Electronically Signed   By: Alcide Clever M.D.   On: 05/05/2023 21:37   CT ABDOMEN PELVIS W CONTRAST  Result Date: 05/05/2023 CLINICAL DATA:  Abdominal pain with nausea and vomiting. Possible small bowel obstruction. EXAM: CT ABDOMEN AND PELVIS WITH CONTRAST TECHNIQUE: Multidetector CT imaging of the abdomen and pelvis was performed using the standard protocol following bolus administration of intravenous contrast. RADIATION DOSE REDUCTION: This exam was performed according to the departmental dose-optimization program which includes automated exposure control, adjustment of the mA and/or kV according to patient size and/or use of iterative reconstruction technique. CONTRAST:  80mL OMNIPAQUE IOHEXOL 300 MG/ML  SOLN COMPARISON:  None Available. FINDINGS: Lower chest: No acute abnormality. Hepatobiliary: No focal liver abnormality is seen. No gallstones, gallbladder wall thickening, or biliary dilatation. Pancreas: Unremarkable. No pancreatic ductal dilatation or surrounding inflammatory changes. Spleen: Normal in size without focal abnormality. Adrenals/Urinary Tract: The adrenal glands are within normal limits. The kidneys enhance symmetrically. No renal calculus or hydronephrosis. The bladder is unremarkable. Stomach/Bowel: The stomach and small bowel are mildly distended with air-fluid levels. The small bowel measures up to 3.2 cm in caliber. A transition point is noted in the mid pelvis, axial image 50. The small bowel is decompressed distal to this point. No free air or pneumatosis. A normal appendix is noted in the right lower quadrant. Vascular/Lymphatic: No significant vascular findings are present. No enlarged abdominal or pelvic lymph nodes. Reproductive: Multiple calcified fibroids are noted in the uterus. No adnexal mass. Other:  No abdominopelvic ascites. Musculoskeletal: Mild degenerative changes are present in the thoracolumbar spine. No acute osseous abnormality is seen. IMPRESSION: 1. Distended stomach and small bowel with air-fluid levels. A transition point is noted in the pelvis, compatible with small-bowel obstruction. Surgical consultation is recommended. 2. Fibroid uterus. Critical Value/emergent results were called by telephone at the time of interpretation on 05/05/2023 at 8:17 pm to provider [facility] , who verbally acknowledged these results. Electronically Signed   By: Thornell Sartorius M.D.   On: 05/05/2023 20:18    Anti-infectives: Anti-infectives (From admission, onward)    None      Assessment/Plan SBO, no history of abdominal surgery   - SBO protocol 7/4 >> contrast on 8h delay is in stomach, not visible on follow up x-rays. Todays x-ray with less SB distention and some air in the colon. - no bowel function. Remains clinically obstructed. - if she is not improving in the next 24 hours I think she will need diagnostic laparoscopy vs laparotomy . - continue NG to LIWS   LOS: 2 days   I reviewed nursing notes, hospitalist notes, last 24 h vitals and pain scores, last 48 h intake and output, last 24 h labs and trends, and last 24 h imaging results.  This care required moderate level of medical decision making.   Hosie Spangle, PA-C [facility] Please see Amion for pager number during day hours 7:00am-4:30pm

## 2023-05-08 DIAGNOSIS — K5651 Intestinal adhesions [bands], with partial obstruction: Secondary | ICD-10-CM | POA: Diagnosis not present

## 2023-05-08 DIAGNOSIS — K56609 Unspecified intestinal obstruction, unspecified as to partial versus complete obstruction: Secondary | ICD-10-CM | POA: Diagnosis not present

## 2023-05-08 NOTE — Progress Notes (Signed)
[facility] Progress Note     Subjective: CC:  States she feels better. Denies flatus or BM but does have some mild cramping, hunger noted.   Objective: Vital signs in last 24 hours: Temp:  [98.1 F (36.7 C)-99.6 F (37.6 C)] 98.9 F (37.2 C) (07/06 0743) Pulse Rate:  [83-105] 87 (07/06 0743) Resp:  [17-18] 17 (07/06 0743) BP: (127-138)/(81-87) 135/84 (07/06 0743) SpO2:  [97 %-99 %] 97 % (07/06 0743)    Intake/Output from previous day: 07/05 0701 - 07/06 0700 In: 2800.6 [I.V.:2750; NG/GT:30; IV Piggyback:20.6] Out: 350 [Emesis/NG output:350] Intake/Output this shift: No intake/output data recorded.  PE: Gen:  Alert, NAD, pleasant Card:  Regular rate and rhythm Pulm:  Normal effort ORA Abd: Soft, non-tender, min distention, no guarding  NG with bilious effluent in cannister, Skin: warm and dry, no rashes  Psych: A&Ox3   Lab Results:  Recent Labs    05/06/23 0207 05/07/23 0759  WBC 7.5 7.9  HGB 12.1 11.9*  HCT 38.1 36.6  PLT 244 223    BMET Recent Labs    05/06/23 0207 05/07/23 0759  NA 142 143  K 3.1* 4.2  CL 100 113*  CO2 23 22  GLUCOSE 83 124*  BUN 23 11  CREATININE 0.80 0.69  CALCIUM 8.7* 8.7*    PT/INR No results for input(s): "LABPROT", "INR" in the last 72 hours. CMP     Component Value Date/Time   NA 143 05/07/2023 0759   K 4.2 05/07/2023 0759   CL 113 (H) 05/07/2023 0759   CO2 22 05/07/2023 0759   GLUCOSE 124 (H) 05/07/2023 0759   BUN 11 05/07/2023 0759   CREATININE 0.69 05/07/2023 0759   CALCIUM 8.7 (L) 05/07/2023 0759   PROT 7.2 05/07/2023 0759   ALBUMIN 3.0 (L) 05/07/2023 0759   AST 24 05/07/2023 0759   ALT 18 05/07/2023 0759   ALKPHOS 52 05/07/2023 0759   BILITOT 1.0 05/07/2023 0759   GFRNONAA >60 05/07/2023 0759   Lipase     Component Value Date/Time   LIPASE 36 05/05/2023 1745       Studies/Results: DG Abd Portable 1V  Result Date: 05/07/2023 CLINICAL DATA:  Small bowel obstruction. EXAM:  PORTABLE ABDOMEN - 1 VIEW COMPARISON:  05/06/2023 FINDINGS: NG tube tip is in the mid stomach. Interval slight decrease in gaseous small bowel dilatation although there are some persistent mildly distended gas-filled small bowel loops in the central abdomen measuring up to 3.4 cm diameter. Distended small bowel seen over the pelvis on the previous study has resolved. IMPRESSION: Interval slight decrease in gaseous small bowel dilatation although there are some persistent mildly distended gas-filled small bowel loops in the central abdomen. Electronically Signed   By: Kennith Center M.D.   On: 05/07/2023 08:50   DG Abd Portable 1V-Small Bowel Obstruction Protocol-initial, 8 hr delay  Result Date: 05/06/2023 CLINICAL DATA:  Small bowel obstruction EXAM: PORTABLE ABDOMEN - 1 VIEW COMPARISON:  Abdominal radiograph dated May 05, 2019 FINDINGS: NG tube tip and side port are in the proximal stomach. Numerous gas-filled dilated loops of small bowel. Contrast material seen in the stomach. Visualized lung bases are clear. IMPRESSION: 1. NG tube tip and side port are in the proximal stomach. 2. Numerous gas-filled dilated loops of small bowel, consistent with small bowel obstruction. Electronically Signed   By: Allegra Lai M.D.   On: 05/06/2023 10:19    Anti-infectives: Anti-infectives (From admission, onward)    None  Assessment/Plan SBO, no history of abdominal surgery   - SBO protocol 7/4 >> contrast on 8h delay is in stomach, not visible on follow up x-rays. Todays x-ray with less SB distention and some air in the colon. - no bowel function. Remains clinically obstructed. - Given sig decreased NG output, AXR findings and some clinical improvement.  I think it would be wise to give it one more day. we will re-eval in AM. I think she will need diagnostic laparoscopy vs laparotomy if no bowel function by then. - continue NG to LIWS   LOS: 3 days   I reviewed nursing notes, hospitalist notes, last  24 h vitals and pain scores, last 48 h intake and output, last 24 h labs and trends, and last 24 h imaging results.  This care required moderate level of medical decision making.   Vanita Panda, MD  Colorectal and General Surgery [facility] Surgery

## 2023-05-08 NOTE — Progress Notes (Signed)
   Sharon Berry  WJX:[id number] DOB: [birth year] DOA: 05/05/2023 PCP: Carmel Sacramento, NP    Brief Narrative:  62 year old with a history of HLD who presented to [facility] 7 3/24 with complaints of generalized abdominal pain nausea and vomiting which have been worsening since 05/01/2023.  In the ER CT abdomen/pelvis noted distended stomach and evidence of an SBO with a transition point in the pelvis.  NG tube was placed and patient reported significant symptomatic improvement afterwards.  Goals of Care:  Code Status: Full Code   DVT prophylaxis: Lovenox  Interim Hx: Afebrile.  Vital signs stable.  No flatus or bowel movement yet but feels better overall.  NG output has declined over the last 24 hours.  Feeling better overall, yet still not passing flatus or having bowel movements.  No chest pain or shortness of breath.  Assessment & Plan:  Small bowel obstruction Continue NG suction and conservative management for now - General Surgery following with [place] -given some clinical signs of improvement we will hold the course with our current management  Hypokalemia Supplemented to goal of 4.0 - magnesium is normal   Family Communication:  Disposition: Eventual discharge home   Objective: Blood pressure 135/84, pulse 87, temperature 98.9 F (37.2 C), temperature source Oral, resp. rate 17, height 5\' 7"  (1.702 m), weight 65.6 kg, SpO2 97 %.  Intake/Output Summary (Last 24 hours) at 05/08/2023 1026 Last data filed at 05/08/2023 0600 Gross per 24 hour  Intake 2800.56 ml  Output 350 ml  Net 2450.56 ml    Filed Weights   05/05/23 1715 05/05/23 2232  Weight: 63.5 kg 65.6 kg    Examination: General: No acute respiratory distress Lungs: Clear to auscultation bilaterally  Cardiovascular: RRR Abdomen: Soft, bowel sounds not appreciable, no rebound, no mass Extremities: No significant edema BLE   CBC: Recent Labs  Lab 05/05/23 1722 05/06/23 0207 05/07/23 0759  WBC 5.5  7.5 7.9  HGB 13.8 12.1 11.9*  HCT 41.7 38.1 36.6  MCV 94.8 93.6 96.8  PLT 272 244 223    Basic Metabolic Panel: Recent Labs  Lab 05/05/23 1745 05/06/23 0207 05/07/23 0759  NA 139 142 143  K 3.2* 3.1* 4.2  CL 96* 100 113*  CO2 27 23 22   GLUCOSE 108* 83 124*  BUN 32* 23 11  CREATININE 0.87 0.80 0.69  CALCIUM 10.2 8.7* 8.7*  MG  --  2.3 2.2  PHOS  --  3.6 2.2*    GFR: Estimated Creatinine Clearance: 71.8 mL/min (by C-G formula based on SCr of 0.69 mg/dL).   Scheduled Meds:  pantoprazole (PROTONIX) IV  40 mg Intravenous Q24H   Continuous Infusions:  dextrose 5 % and 0.9 % NaCl with KCl 40 mEq/L 75 mL/hr at 05/08/23 0656     LOS: 3 days   Lonia Blood, MD [facility] Office  [phone] Pager - Text Page per Loretha Stapler  If 7PM-7AM, please contact night-coverage per Amion 05/08/2023, 10:26 AM

## 2023-05-09 ENCOUNTER — Inpatient Hospital Stay (HOSPITAL_COMMUNITY): Payer: Medicaid Other | Admitting: Anesthesiology

## 2023-05-09 ENCOUNTER — Other Ambulatory Visit: Payer: Self-pay

## 2023-05-09 ENCOUNTER — Encounter (HOSPITAL_COMMUNITY): Payer: Self-pay | Admitting: Internal Medicine

## 2023-05-09 ENCOUNTER — Inpatient Hospital Stay (HOSPITAL_COMMUNITY): Payer: Medicaid Other

## 2023-05-09 ENCOUNTER — Encounter (HOSPITAL_COMMUNITY)
Admission: EM | Disposition: A | Discharge status: Home / Self Care | Payer: Self-pay | Source: Home / Self Care | Attending: Internal Medicine

## 2023-05-09 DIAGNOSIS — K565 Intestinal adhesions [bands], unspecified as to partial versus complete obstruction: Secondary | ICD-10-CM

## 2023-05-09 DIAGNOSIS — Z4682 Encounter for fitting and adjustment of non-vascular catheter: Secondary | ICD-10-CM | POA: Diagnosis not present

## 2023-05-09 DIAGNOSIS — K56609 Unspecified intestinal obstruction, unspecified as to partial versus complete obstruction: Secondary | ICD-10-CM | POA: Diagnosis not present

## 2023-05-09 DIAGNOSIS — R140 Abdominal distension (gaseous): Secondary | ICD-10-CM | POA: Diagnosis not present

## 2023-05-09 DIAGNOSIS — K5651 Intestinal adhesions [bands], with partial obstruction: Secondary | ICD-10-CM | POA: Diagnosis not present

## 2023-05-09 HISTORY — PX: LAPAROSCOPY: SHX197

## 2023-05-09 LAB — COMPREHENSIVE METABOLIC PANEL
ALT: 23 U/L (ref 0–44)
AST: 20 U/L (ref 15–41)
Albumin: 2.8 g/dL — ABNORMAL LOW (ref 3.5–5.0)
Alkaline Phosphatase: 55 U/L (ref 38–126)
Anion gap: 9 (ref 5–15)
BUN: 12 mg/dL (ref 8–23)
CO2: 23 mmol/L (ref 22–32)
Calcium: 8.5 mg/dL — ABNORMAL LOW (ref 8.9–10.3)
Chloride: 114 mmol/L — ABNORMAL HIGH (ref 98–111)
Creatinine, Ser: 0.79 mg/dL (ref 0.44–1.00)
GFR, Estimated: >60 mL/min (ref >60)
Glucose, Bld: 138 mg/dL — ABNORMAL HIGH (ref 70–99)
Potassium: 3.8 mmol/L (ref 3.5–5.1)
Sodium: 146 mmol/L — ABNORMAL HIGH (ref 135–145)
Total Bilirubin: 0.6 mg/dL (ref 0.3–1.2)
Total Protein: 7.1 g/dL (ref 6.5–8.1)

## 2023-05-09 LAB — CBC
HCT: 37.1 % (ref 36.0–46.0)
Hemoglobin: 11.8 g/dL — ABNORMAL LOW (ref 12.0–15.0)
MCH: 30.3 pg (ref 26.0–34.0)
MCHC: 31.8 g/dL (ref 30.0–36.0)
MCV: 95.1 fL (ref 80.0–100.0)
Platelets: 214 10*3/uL (ref 150–400)
RBC: 3.9 MIL/uL (ref 3.87–5.11)
RDW: 13.4 % (ref 11.5–15.5)
WBC: 8.9 10*3/uL (ref 4.0–10.5)
nRBC: 0 % (ref 0.0–0.2)

## 2023-05-09 LAB — MAGNESIUM: Magnesium: 2 mg/dL (ref 1.7–2.4)

## 2023-05-09 LAB — PHOSPHORUS: Phosphorus: 3.1 mg/dL (ref 2.5–4.6)

## 2023-05-09 LAB — NO BLOOD PRODUCTS

## 2023-05-09 SURGERY — LAPAROSCOPY, DIAGNOSTIC
Anesthesia: General

## 2023-05-09 MED ORDER — SODIUM CHLORIDE 0.9 % IV SOLN: 12.5000 mg | Freq: Four times a day (QID) | INTRAVENOUS | Status: DC | PRN

## 2023-05-09 MED ORDER — CHLORHEXIDINE GLUCONATE 0.12 % MT SOLN
15.0000 mL | Freq: Once | OROMUCOSAL | Status: AC
  Administered 2023-05-09: 15 mL via OROMUCOSAL

## 2023-05-09 MED ORDER — ONDANSETRON HCL 4 MG/2ML IJ SOLN
INTRAMUSCULAR | Status: AC
  Filled 2023-05-09: qty 2

## 2023-05-09 MED ORDER — LACTATED RINGERS IV SOLN: INTRAVENOUS | Status: DC | PRN

## 2023-05-09 MED ORDER — FENTANYL CITRATE (PF) 250 MCG/5ML IJ SOLN
INTRAMUSCULAR | Status: DC | PRN
  Administered 2023-05-09: 50 ug via INTRAVENOUS
  Administered 2023-05-09: 100 ug via INTRAVENOUS
  Administered 2023-05-09 (×2): 50 ug via INTRAVENOUS

## 2023-05-09 MED ORDER — ONDANSETRON HCL 4 MG/2ML IJ SOLN
INTRAMUSCULAR | Status: DC | PRN
  Administered 2023-05-09: 4 mg via INTRAVENOUS

## 2023-05-09 MED ORDER — PHENYLEPHRINE 80 MCG/ML (10ML) SYRINGE FOR IV PUSH (FOR BLOOD PRESSURE SUPPORT)
PREFILLED_SYRINGE | INTRAVENOUS | Status: DC | PRN
  Administered 2023-05-09: 160 ug via INTRAVENOUS

## 2023-05-09 MED ORDER — SUCCINYLCHOLINE CHLORIDE 200 MG/10ML IV SOSY
PREFILLED_SYRINGE | INTRAVENOUS | Status: DC | PRN
  Administered 2023-05-09: 100 mg via INTRAVENOUS

## 2023-05-09 MED ORDER — ENOXAPARIN SODIUM 40 MG/0.4ML IJ SOSY
40.0000 mg | PREFILLED_SYRINGE | INTRAMUSCULAR | Status: DC
  Administered 2023-05-10 – 2023-05-13 (×3): 40 mg via SUBCUTANEOUS
  Filled 2023-05-09 (×3): qty 0.4

## 2023-05-09 MED ORDER — BUPIVACAINE LIPOSOME 1.3 % IJ SUSP
INTRAMUSCULAR | Status: DC | PRN
  Administered 2023-05-09: 40 mL via INTRAMUSCULAR

## 2023-05-09 MED ORDER — LIDOCAINE 2% (20 MG/ML) 5 ML SYRINGE
INTRAMUSCULAR | Status: DC | PRN
  Administered 2023-05-09: 60 mg via INTRAVENOUS

## 2023-05-09 MED ORDER — PROPOFOL 10 MG/ML IV BOLUS
INTRAVENOUS | Status: DC | PRN
  Administered 2023-05-09: 110 mg via INTRAVENOUS
  Administered 2023-05-09: 30 mg via INTRAVENOUS

## 2023-05-09 MED ORDER — ROCURONIUM BROMIDE 10 MG/ML (PF) SYRINGE
PREFILLED_SYRINGE | INTRAVENOUS | Status: AC
  Filled 2023-05-09: qty 10

## 2023-05-09 MED ORDER — BUPIVACAINE LIPOSOME 1.3 % IJ SUSP
INTRAMUSCULAR | Status: AC
  Filled 2023-05-09: qty 20

## 2023-05-09 MED ORDER — LIDOCAINE 2% (20 MG/ML) 5 ML SYRINGE
INTRAMUSCULAR | Status: AC
  Filled 2023-05-09: qty 5

## 2023-05-09 MED ORDER — ALBUMIN HUMAN 5 % IV SOLN: INTRAVENOUS | Status: DC | PRN

## 2023-05-09 MED ORDER — MIDAZOLAM HCL 2 MG/2ML IJ SOLN
INTRAMUSCULAR | Status: DC | PRN
  Administered 2023-05-09: 2 mg via INTRAVENOUS

## 2023-05-09 MED ORDER — FENTANYL CITRATE (PF) 250 MCG/5ML IJ SOLN
INTRAMUSCULAR | Status: AC
  Filled 2023-05-09: qty 5

## 2023-05-09 MED ORDER — PROPOFOL 10 MG/ML IV BOLUS
INTRAVENOUS | Status: AC
  Filled 2023-05-09: qty 20

## 2023-05-09 MED ORDER — LACTATED RINGERS IV SOLN: INTRAVENOUS | Status: DC

## 2023-05-09 MED ORDER — ROCURONIUM BROMIDE 10 MG/ML (PF) SYRINGE
PREFILLED_SYRINGE | INTRAVENOUS | Status: DC | PRN
  Administered 2023-05-09: 50 mg via INTRAVENOUS

## 2023-05-09 MED ORDER — ORAL CARE MOUTH RINSE: 15.0000 mL | Freq: Once | OROMUCOSAL | Status: AC

## 2023-05-09 MED ORDER — AMISULPRIDE (ANTIEMETIC) 5 MG/2ML IV SOLN: 10.0000 mg | Freq: Once | INTRAVENOUS | Status: DC | PRN

## 2023-05-09 MED ORDER — HYDROMORPHONE HCL 1 MG/ML IJ SOLN: 0.2500 mg | INTRAMUSCULAR | Status: DC | PRN

## 2023-05-09 MED ORDER — DEXAMETHASONE SODIUM PHOSPHATE 10 MG/ML IJ SOLN
INTRAMUSCULAR | Status: DC | PRN
  Administered 2023-05-09: 10 mg via INTRAVENOUS

## 2023-05-09 MED ORDER — PHENYLEPHRINE HCL-NACL 20-0.9 MG/250ML-% IV SOLN
INTRAVENOUS | Status: DC | PRN
  Administered 2023-05-09: 25 ug/min via INTRAVENOUS

## 2023-05-09 MED ORDER — PROMETHAZINE HCL 25 MG/ML IJ SOLN: 6.2500 mg | INTRAMUSCULAR | Status: DC | PRN

## 2023-05-09 MED ORDER — SUGAMMADEX SODIUM 200 MG/2ML IV SOLN
INTRAVENOUS | Status: DC | PRN
  Administered 2023-05-09: 200 mg via INTRAVENOUS

## 2023-05-09 MED ORDER — MIDAZOLAM HCL 2 MG/2ML IJ SOLN
INTRAMUSCULAR | Status: AC
  Filled 2023-05-09: qty 2

## 2023-05-09 MED ORDER — SODIUM CHLORIDE 0.9 % IV SOLN
1.0000 g | INTRAVENOUS | Status: AC
  Administered 2023-05-09: 2 g via INTRAVENOUS
  Filled 2023-05-09: qty 1

## 2023-05-09 MED ORDER — BUPIVACAINE HCL (PF) 0.25 % IJ SOLN
INTRAMUSCULAR | Status: AC
  Filled 2023-05-09: qty 20

## 2023-05-09 MED ORDER — MORPHINE SULFATE (PF) 2 MG/ML IV SOLN
1.0000 mg | INTRAVENOUS | Status: DC | PRN
  Administered 2023-05-09 – 2023-05-13 (×17): 2 mg via INTRAVENOUS
  Filled 2023-05-09 (×17): qty 1

## 2023-05-09 MED ORDER — DEXAMETHASONE SODIUM PHOSPHATE 10 MG/ML IJ SOLN
INTRAMUSCULAR | Status: AC
  Filled 2023-05-09: qty 1

## 2023-05-09 SURGICAL SUPPLY — 56 items
ADH SKN CLS APL DERMABOND .7 (GAUZE/BANDAGES/DRESSINGS) ×1
ADH SKN CLS LQ APL DERMABOND (GAUZE/BANDAGES/DRESSINGS) ×1
APL PRP STRL LF DISP 70% ISPRP (MISCELLANEOUS) ×1
BAG COUNTER SPONGE SURGICOUNT (BAG) ×1 IMPLANT
BAG SPNG CNTER NS LX DISP (BAG) ×1
BLADE CLIPPER SURG (BLADE) IMPLANT
CANISTER SUCT 3000ML PPV (MISCELLANEOUS) ×1 IMPLANT
CHLORAPREP W/TINT 26 (MISCELLANEOUS) ×1 IMPLANT
COVER SURGICAL LIGHT HANDLE (MISCELLANEOUS) ×1 IMPLANT
DERMABOND ADVANCED .7 DNX12 (GAUZE/BANDAGES/DRESSINGS) IMPLANT
DERMABOND ADVANCED .7 DNX6 (GAUZE/BANDAGES/DRESSINGS) ×1 IMPLANT
DRAPE LAPAROSCOPIC ABDOMINAL (DRAPES) ×1 IMPLANT
DRAPE UNIVERSAL (DRAPES) ×1 IMPLANT
DRAPE WARM FLUID 44X44 (DRAPES) ×1 IMPLANT
DRSG OPSITE POSTOP 4X10 (GAUZE/BANDAGES/DRESSINGS) IMPLANT
DRSG OPSITE POSTOP 4X8 (GAUZE/BANDAGES/DRESSINGS) IMPLANT
ELECT BLADE 6.5 EXT (BLADE) IMPLANT
ELECT CAUTERY BLADE 6.4 (BLADE) ×1 IMPLANT
ELECT REM PT RETURN 9FT ADLT (ELECTROSURGICAL) ×1
ELECTRODE REM PT RTRN 9FT ADLT (ELECTROSURGICAL) ×1 IMPLANT
GLOVE BIO SURGEON STRL SZ 6.5 (GLOVE) ×1 IMPLANT
GLOVE BIOGEL PI IND STRL 6 (GLOVE) ×1 IMPLANT
GOWN STRL REUS W/ TWL LRG LVL3 (GOWN DISPOSABLE) ×2 IMPLANT
GOWN STRL REUS W/TWL LRG LVL3 (GOWN DISPOSABLE) ×2
HANDLE SUCTION POOLE (INSTRUMENTS) ×1 IMPLANT
IRRIG SUCT STRYKERFLOW 2 WTIP (MISCELLANEOUS)
IRRIGATION SUCT STRKRFLW 2 WTP (MISCELLANEOUS) IMPLANT
KIT BASIN OR (CUSTOM PROCEDURE TRAY) ×1 IMPLANT
KIT TURNOVER KIT B (KITS) ×1 IMPLANT
LIGASURE IMPACT 36 18CM CVD LR (INSTRUMENTS) IMPLANT
NS IRRIG 1000ML POUR BTL (IV SOLUTION) ×2 IMPLANT
PACK GENERAL/GYN (CUSTOM PROCEDURE TRAY) ×1 IMPLANT
PAD ARMBOARD 7.5X6 YLW CONV (MISCELLANEOUS) ×2 IMPLANT
PENCIL SMOKE EVACUATOR (MISCELLANEOUS) ×1 IMPLANT
SCISSORS LAP 5X35 DISP (ENDOMECHANICALS) IMPLANT
SET TUBE SMOKE EVAC HIGH FLOW (TUBING) ×1 IMPLANT
SLEEVE Z-THREAD 5X100MM (TROCAR) ×1 IMPLANT
SPONGE T-LAP 18X18 ~~LOC~~+RFID (SPONGE) IMPLANT
STAPLER VISISTAT 35W (STAPLE) ×1 IMPLANT
SUCTION POOLE HANDLE (INSTRUMENTS) ×1
SUT MNCRL AB 4-0 PS2 18 (SUTURE) ×1 IMPLANT
SUT PDS AB 1 TP1 54 (SUTURE) IMPLANT
SUT PDS AB 1 TP1 96 (SUTURE) IMPLANT
SUT SILK 2 0 SH CR/8 (SUTURE) ×1 IMPLANT
SUT SILK 2 0 TIES 10X30 (SUTURE) ×1 IMPLANT
SUT SILK 3 0 SH CR/8 (SUTURE) ×1 IMPLANT
SUT SILK 3 0 TIES 10X30 (SUTURE) ×1 IMPLANT
SUT VIC AB 3-0 SH 18 (SUTURE) IMPLANT
TOWEL GREEN STERILE (TOWEL DISPOSABLE) ×1 IMPLANT
TOWEL GREEN STERILE FF (TOWEL DISPOSABLE) ×1 IMPLANT
TRAY FOLEY MTR SLVR 16FR STAT (SET/KITS/TRAYS/PACK) IMPLANT
TRAY LAPAROSCOPIC MC (CUSTOM PROCEDURE TRAY) ×1 IMPLANT
TROCAR BALLN 12MMX100 BLUNT (TROCAR) IMPLANT
TROCAR Z-THREAD OPTICAL 5X100M (TROCAR) ×1 IMPLANT
WARMER LAPAROSCOPE (MISCELLANEOUS) ×1 IMPLANT
YANKAUER SUCT BULB TIP NO VENT (SUCTIONS) IMPLANT

## 2023-05-09 NOTE — Anesthesia Procedure Notes (Signed)
Procedure Name: Intubation Date/Time: 05/09/2023 1:07 PM  Performed by: Nils Pyle, CRNAPre-anesthesia Checklist: Patient identified, Emergency Drugs available, Suction available and Patient being monitored Patient Re-evaluated:Patient Re-evaluated prior to induction Oxygen Delivery Method: Circle System Utilized Preoxygenation: Pre-oxygenation with 100% oxygen Induction Type: IV induction, Rapid sequence and Cricoid Pressure applied Laryngoscope Size: Miller and 2 Grade View: Grade I Tube type: Oral Tube size: 7.0 mm Number of attempts: 1 Airway Equipment and Method: Stylet and Oral airway Placement Confirmation: ETT inserted through vocal cords under direct vision, positive ETCO2 and breath sounds checked- equal and bilateral Secured at: 22 cm Tube secured with: Tape Dental Injury: Teeth and Oropharynx as per pre-operative assessment

## 2023-05-09 NOTE — Op Note (Signed)
   Operative Note   Date: 05/09/2023  Procedure: diagnostic laparoscopy,lysis of single adhesive band  Pre-op diagnosis: small bowel obstruction Post-op diagnosis: small bowel obstruction secondary to single adhesive band  Indication and clinical history: The patient is a 62 y.o. year old female with small bowel obstruction refractory to non-operative management     Surgeon: Diamantina Monks, MD  Anesthesiologist: Renold Don, MD Anesthesia: General  Findings:  Specimen: none EBL: <5cc Drains/Implants: none  Disposition: PACU - hemodynamically stable.  Description of procedure: The patient was positioned supine on the operating room table. General anesthetic induction and intubation were uneventful. Foley catheter insertion was performed and was atraumatic. Time-out was performed verifying correct patient, procedure, signature of informed consent, and administration of pre-operative antibiotics. The abdomen was prepped and draped in the usual sterile fashion.  The abdomen was entered using an optiview technique with a 5mm port in the left upper quadrant. The abdomen was insufflated and inspected, confirming no intra-abdominal injury on entry. Two additional 5mm ports placed under direct visualization in the left abdomen. Local anesthetic was infiltrated at each of these sites prior to port placement.  The small bowel was run from ileocecal valve to ligament of Treitz. At the mid-jejunum, an area of obstruction was identified caused by an adhesive band. This was lysed sharply. The adjacent bowel was mildly hemorrhagic, but viable. No other areas of obstruction were identified. The abdomen was desufflated and the ports were removed. The skin was closed using 4-0 Monocryl suture and Dermabond.  All sponge and instrument counts were correct at the conclusion of the procedure. The patient was awakened from anesthesia, extubated uneventfully, and transported to the PACU in good condition. There  were no complications.     Diamantina Monks, MD General and Trauma Surgery [facility] Surgery

## 2023-05-09 NOTE — Anesthesia Postprocedure Evaluation (Signed)
Anesthesia Post Note  Patient: Sharon Berry  Procedure(s) Performed: LAPAROSCOPY DIAGNOSTIC     Patient location during evaluation: PACU Anesthesia Type: General Level of consciousness: sedated and patient cooperative Pain management: pain level controlled Vital Signs Assessment: post-procedure vital signs reviewed and stable Respiratory status: spontaneous breathing Cardiovascular status: stable Anesthetic complications: no   No notable events documented.  Last Vitals:  Vitals:   05/09/23 1445 05/09/23 1511  BP: (!) 146/83 (!) 149/80  Pulse: 100 98  Resp: 18 16  Temp: 36.7 C (!) 36.4 C  SpO2: 99% 98%    Last Pain:  Vitals:   05/09/23 1519  TempSrc:   PainSc: 9                  Gabreille Dardis

## 2023-05-09 NOTE — Anesthesia Preprocedure Evaluation (Addendum)
Anesthesia Evaluation  Patient identified by MRN, date of birth, ID band Patient awake    Reviewed: Allergy & Precautions, NPO status , Patient's Chart, lab work & pertinent test results  Airway Mallampati: II  TM Distance: >3 FB Neck ROM: Full    Dental  (+) Dental Advisory Given, Teeth Intact   Pulmonary neg pulmonary ROS   Pulmonary exam normal breath sounds clear to auscultation       Cardiovascular negative cardio ROS Normal cardiovascular exam Rhythm:Regular Rate:Normal     Neuro/Psych  PSYCHIATRIC DISORDERS  Depression    negative neurological ROS     GI/Hepatic negative GI ROS, Neg liver ROS,,,  Endo/Other  negative endocrine ROS    Renal/GU negative Renal ROS     Musculoskeletal  (+) Arthritis ,    Abdominal   Peds  Hematology negative hematology ROS (+)   Anesthesia Other Findings   Reproductive/Obstetrics                             Anesthesia Physical Anesthesia Plan  ASA: 2 and emergent  Anesthesia Plan: General   Post-op Pain Management: Ofirmev IV (intra-op)*   Induction: Intravenous, Rapid sequence and Cricoid pressure planned  PONV Risk Score and Plan: 4 or greater and Ondansetron, Dexamethasone, Treatment may vary due to age or medical condition and Midazolam  Airway Management Planned: Oral ETT  Additional Equipment:   Intra-op Plan:   Post-operative Plan: Extubation in OR  Informed Consent: I have reviewed the patients History and Physical, chart, labs and discussed the procedure including the risks, benefits and alternatives for the proposed anesthesia with the patient or authorized representative who has indicated his/her understanding and acceptance.     Dental advisory given  Plan Discussed with: CRNA  Anesthesia Plan Comments:         Anesthesia Quick Evaluation

## 2023-05-09 NOTE — Progress Notes (Addendum)
Patient seen and examined. SBO refractory to non-op management. Plan for dx lap, possible exlap. Informed consent obtained, all questions answered. Informed blood refusal, will accept albumin.   Diamantina Monks, MD General and Trauma Surgery [facility] Surgery

## 2023-05-09 NOTE — Transfer of Care (Signed)
Immediate Anesthesia Transfer of Care Note  Patient: Sharon Berry  Procedure(s) Performed: LAPAROSCOPY DIAGNOSTIC  Patient Location: PACU  Anesthesia Type:General  Level of Consciousness: awake, alert , and oriented  Airway & Oxygen Therapy: Patient Spontanous Breathing  Post-op Assessment: Report given to RN, Post -op Vital signs reviewed and stable, and Patient moving all extremities X 4  Post vital signs: Reviewed and stable  Last Vitals:  Vitals Value Taken Time  BP 132/89 05/09/23 1408  Temp    Pulse 114 05/09/23 1411  Resp 20 05/09/23 1411  SpO2 97 % 05/09/23 1411  Vitals shown include unvalidated device data.  Last Pain:  Vitals:   05/09/23 1228  TempSrc:   PainSc: 0-No pain      Patients Stated Pain Goal: 0 (05/09/23 1016)  Complications: No notable events documented.

## 2023-05-09 NOTE — Progress Notes (Signed)
   Sharon Berry  ZOX:[id number] DOB: Jul 19, 1961 DOA: 05/05/2023 PCP: Carmel [place], NP    Brief Narrative:  62 year old with a history of HLD who presented to [facility] 7 3/24 with complaints of generalized abdominal pain nausea and vomiting which have been worsening since 05/01/2023.  In the ER CT abdomen/pelvis noted distended stomach and evidence of an SBO with a transition point in the pelvis.  NG tube was placed and patient reported significant symptomatic improvement afterwards.  Goals of Care:  Code Status: Full Code   DVT prophylaxis: Lovenox  Interim Hx: No acute events recorded overnight.  Afebrile.  Vital signs stable.  No bowel movement and no flatus thus far.  Otherwise no complaints.  Denies chest pain or shortness of breath.  Assessment & Plan:  Small bowel obstruction has failed to resolve with supportive medical care - plan for diagnostic laparoscopy versus laparotomy later today  Hypokalemia Supplemented to goal of 4.0 - magnesium is normal   Family Communication:  Disposition: Eventual discharge home   Objective: Blood pressure 122/79, pulse 80, temperature 98.3 F (36.8 C), temperature source Oral, resp. rate 18, height 5\' 7"  (1.702 m), weight 65.6 kg, SpO2 100 %.  Intake/Output Summary (Last 24 hours) at 05/09/2023 0940 Last data filed at 05/09/2023 0400 Gross per 24 hour  Intake 30 ml  Output 1350 ml  Net -1320 ml    Filed Weights   05/05/23 1715 05/05/23 2232  Weight: 63.5 kg 65.6 kg    Examination: General: No acute respiratory distress Lungs: Clear to auscultation bilaterally  Cardiovascular: RRR Abdomen: Soft, bowel sounds not appreciable, no rebound, no mass Extremities: No significant edema BLE   CBC: Recent Labs  Lab 05/06/23 0207 05/07/23 0759 05/09/23 0326  WBC 7.5 7.9 8.9  HGB 12.1 11.9* 11.8*  HCT 38.1 36.6 37.1  MCV 93.6 96.8 95.1  PLT 244 223 214    Basic Metabolic Panel: Recent Labs  Lab 05/06/23 0207  05/07/23 0759 05/09/23 0326  NA 142 143 146*  K 3.1* 4.2 3.8  CL 100 113* 114*  CO2 23 22 23   GLUCOSE 83 124* 138*  BUN 23 11 12   CREATININE 0.80 0.69 0.79  CALCIUM 8.7* 8.7* 8.5*  MG 2.3 2.2 2.0  PHOS 3.6 2.2* 3.1    GFR: Estimated Creatinine Clearance: 71.8 mL/min (by C-G formula based on SCr of 0.79 mg/dL).   Scheduled Meds:  pantoprazole (PROTONIX) IV  40 mg Intravenous Q24H   Continuous Infusions:  cefoTEtan (CEFOTAN) IV     dextrose 5 % and 0.9 % NaCl with KCl 40 mEq/L 75 mL/hr at 05/08/23 2026     LOS: 4 days   Lonia Blood, MD [facility] Office  [phone] Pager - Text Page per Loretha Stapler  If 7PM-7AM, please contact night-coverage per Amion 05/09/2023, 9:40 AM

## 2023-05-09 NOTE — Progress Notes (Signed)
[facility] Progress Note     Subjective: CC:  No better today. Denies flatus or BM   Objective: Vital signs in last 24 hours: Temp:  [98 F (36.7 C)-98.4 F (36.9 C)] 98 F (36.7 C) (07/07 0354) Pulse Rate:  [89-93] 89 (07/07 0354) Resp:  [17-18] 18 (07/07 0354) BP: (122-126)/(75-81) 124/75 (07/07 0354) SpO2:  [98 %-100 %] 99 % (07/07 0354)    Intake/Output from previous day: 07/06 0701 - 07/07 0700 In: 30 [NG/GT:30] Out: 1350 [Emesis/NG output:1350] Intake/Output this shift: No intake/output data recorded.  PE: Gen:  Alert, NAD, pleasant Card:  Regular rate and rhythm Pulm:  Normal effort ORA Abd: Soft, non-tender, min distention, no guarding  NG with bilious effluent in cannister, Skin: warm and dry, no rashes  Psych: A&Ox3   Lab Results:  Recent Labs    05/07/23 0759 05/09/23 0326  WBC 7.9 8.9  HGB 11.9* 11.8*  HCT 36.6 37.1  PLT 223 214    BMET Recent Labs    05/07/23 0759 05/09/23 0326  NA 143 146*  K 4.2 3.8  CL 113* 114*  CO2 22 23  GLUCOSE 124* 138*  BUN 11 12  CREATININE 0.69 0.79  CALCIUM 8.7* 8.5*    PT/INR No results for input(s): "LABPROT", "INR" in the last 72 hours. CMP     Component Value Date/Time   NA 146 (H) 05/09/2023 0326   K 3.8 05/09/2023 0326   CL 114 (H) 05/09/2023 0326   CO2 23 05/09/2023 0326   GLUCOSE 138 (H) 05/09/2023 0326   BUN 12 05/09/2023 0326   CREATININE 0.79 05/09/2023 0326   CALCIUM 8.5 (L) 05/09/2023 0326   PROT 7.1 05/09/2023 0326   ALBUMIN 2.8 (L) 05/09/2023 0326   AST 20 05/09/2023 0326   ALT 23 05/09/2023 0326   ALKPHOS 55 05/09/2023 0326   BILITOT 0.6 05/09/2023 0326   GFRNONAA >60 05/09/2023 0326   Lipase     Component Value Date/Time   LIPASE 36 05/05/2023 1745       Studies/Results: No results found.  Anti-infectives: Anti-infectives (From admission, onward)    None      Assessment/Plan SBO, no history of abdominal surgery   - SBO protocol 7/4 >>  contrast on 8h delay is in stomach, not visible on follow up x-rays. Todays x-ray with no further change - no bowel function. Remains clinically obstructed. -  I think she will need diagnostic laparoscopy vs laparotomy today.  Will discuss with surgeon on call - continue NG to LIWS   LOS: 4 days   I reviewed nursing notes, hospitalist notes, last 24 h vitals and pain scores, last 48 h intake and output, last 24 h labs and trends, and last 24 h imaging results.  This care required moderate level of medical decision making.   Vanita Panda, MD  Colorectal and General Surgery [facility] Surgery

## 2023-05-10 ENCOUNTER — Encounter (HOSPITAL_COMMUNITY): Payer: Self-pay | Admitting: Surgery

## 2023-05-10 DIAGNOSIS — K56609 Unspecified intestinal obstruction, unspecified as to partial versus complete obstruction: Secondary | ICD-10-CM | POA: Diagnosis not present

## 2023-05-10 LAB — BASIC METABOLIC PANEL
Anion gap: 10 (ref 5–15)
BUN: 6 mg/dL — ABNORMAL LOW (ref 8–23)
CO2: 20 mmol/L — ABNORMAL LOW (ref 22–32)
Calcium: 7.8 mg/dL — ABNORMAL LOW (ref 8.9–10.3)
Chloride: 112 mmol/L — ABNORMAL HIGH (ref 98–111)
Creatinine, Ser: 0.72 mg/dL (ref 0.44–1.00)
GFR, Estimated: >60 mL/min (ref >60)
Glucose, Bld: 131 mg/dL — ABNORMAL HIGH (ref 70–99)
Potassium: 4.1 mmol/L (ref 3.5–5.1)
Sodium: 142 mmol/L (ref 135–145)

## 2023-05-10 LAB — CBC
HCT: 35.6 % — ABNORMAL LOW (ref 36.0–46.0)
Hemoglobin: 11.7 g/dL — ABNORMAL LOW (ref 12.0–15.0)
MCH: 31.2 pg (ref 26.0–34.0)
MCHC: 32.9 g/dL (ref 30.0–36.0)
MCV: 94.9 fL (ref 80.0–100.0)
Platelets: 132 10*3/uL — ABNORMAL LOW (ref 150–400)
RBC: 3.75 MIL/uL — ABNORMAL LOW (ref 3.87–5.11)
RDW: 13.2 % (ref 11.5–15.5)
WBC: 9.9 10*3/uL (ref 4.0–10.5)
nRBC: 0 % (ref 0.0–0.2)

## 2023-05-10 LAB — MAGNESIUM: Magnesium: 1.8 mg/dL (ref 1.7–2.4)

## 2023-05-10 LAB — PHOSPHORUS: Phosphorus: 3.3 mg/dL (ref 2.5–4.6)

## 2023-05-10 NOTE — Progress Notes (Addendum)
   Sharon Berry  ZOX:[id number] DOB: August 17, 1961 DOA: 05/05/2023 PCP: Carmel [place], NP    Brief Narrative:  62 year old with a history of HLD who presented to [facility] 7 3/24 with complaints of generalized abdominal pain nausea and vomiting which have been worsening since 05/01/2023.  In the ER CT abdomen/pelvis noted distended stomach and evidence of an SBO with a transition point in the pelvis.  NG tube was placed and patient reported significant symptomatic improvement afterwards.  Goals of Care:  Code Status: Full Code   DVT prophylaxis: Lovenox  Interim Hx: Taken to the OR last night for a diagnostic laparoscopy with lysis of a single adhesive band.  No bowel movements thus far postoperatively. Resting comfortably in bed. No flatus or BM yet. No signif abdom pain.   In that all active issues at present are surgical in nature, with the patient having no signif chronic medical issues, the Gen Surgery team has graciously agreed to assume the Attending role for this patient. TRH will sign off.   Assessment & Plan:  Small bowel obstruction failed to resolve with supportive medical care - underwent laparoscopy with lysis of 1 adhesive band 05/09/2023 - postoperative care per General Surgery  Hypokalemia Supplemented to goal of 4.0 - magnesium is normal   Family Communication:  Disposition: Eventual discharge home   Objective: Blood pressure 139/87, pulse (!) 102, temperature 98.9 F (37.2 C), temperature source Oral, resp. rate 16, height 5' 7.01" (1.702 m), weight 65.6 kg, SpO2 97 %.  Intake/Output Summary (Last 24 hours) at 05/10/2023 1000 Last data filed at 05/10/2023 0600 Gross per 24 hour  Intake 1650 ml  Output 1540 ml  Net 110 ml    Filed Weights   05/05/23 1715 05/05/23 2232 05/09/23 1228  Weight: 63.5 kg 65.6 kg 65.6 kg    Examination: General: No acute respiratory distress Lungs: Clear to auscultation bilaterally  Cardiovascular: RRR Abdomen:  Soft, bowel sounds not appreciable, no rebound, no mass Extremities: No significant edema BLE   CBC: Recent Labs  Lab 05/07/23 0759 05/09/23 0326 05/10/23 0331  WBC 7.9 8.9 9.9  HGB 11.9* 11.8* 11.7*  HCT 36.6 37.1 35.6*  MCV 96.8 95.1 94.9  PLT 223 214 132*    Basic Metabolic Panel: Recent Labs  Lab 05/07/23 0759 05/09/23 0326 05/10/23 0331  NA 143 146* 142  K 4.2 3.8 4.1  CL 113* 114* 112*  CO2 22 23 20*  GLUCOSE 124* 138* 131*  BUN 11 12 6*  CREATININE 0.69 0.79 0.72  CALCIUM 8.7* 8.5* 7.8*  MG 2.2 2.0 1.8  PHOS 2.2* 3.1 3.3    GFR: Estimated Creatinine Clearance: 71.8 mL/min (by C-G formula based on SCr of 0.72 mg/dL).   Scheduled Meds:  enoxaparin (LOVENOX) injection  40 mg Subcutaneous Q24H   pantoprazole (PROTONIX) IV  40 mg Intravenous Q24H   Continuous Infusions:  dextrose 5 % and 0.9 % NaCl with KCl 40 mEq/L 75 mL/hr at 05/10/23 0144   promethazine (PHENERGAN) injection (IM or IVPB)       LOS: 5 days   Lonia Blood, MD [facility] Office  [phone] Pager - Text Page per Loretha Stapler  If 7PM-7AM, please contact night-coverage per Amion 05/10/2023, 10:00 AM

## 2023-05-10 NOTE — Progress Notes (Signed)
[facility] Progress Note  1 Day Post-Op  Subjective: No bowel function yet.  Feels some rumbling   Objective: Vital signs in last 24 hours: Temp:  [97.5 F (36.4 C)-98.9 F (37.2 C)] 98.9 F (37.2 C) (07/08 0736) Pulse Rate:  [86-114] 102 (07/08 0736) Resp:  [14-18] 16 (07/08 0736) BP: (122-149)/(80-89) 139/87 (07/08 0736) SpO2:  [96 %-100 %] 97 % (07/08 0736) Weight:  [65.6 kg] 65.6 kg (07/07 1228)    Intake/Output from previous day: 07/07 0701 - 07/08 0700 In: 1650 [I.V.:1200; IV Piggyback:450] Out: 1540 [Urine:680; Emesis/NG output:850; Blood:10] Intake/Output this shift: No intake/output data recorded.  PE: Abd: soft, appropriately tender, incisions c/d/I, NGT with some bilious output, but currently minimal.   Lab Results:  Recent Labs    05/09/23 0326 05/10/23 0331  WBC 8.9 9.9  HGB 11.8* 11.7*  HCT 37.1 35.6*  PLT 214 132*   BMET Recent Labs    05/09/23 0326 05/10/23 0331  NA 146* 142  K 3.8 4.1  CL 114* 112*  CO2 23 20*  GLUCOSE 138* 131*  BUN 12 6*  CREATININE 0.79 0.72  CALCIUM 8.5* 7.8*   PT/INR No results for input(s): "LABPROT", "INR" in the last 72 hours. CMP     Component Value Date/Time   NA 142 05/10/2023 0331   K 4.1 05/10/2023 0331   CL 112 (H) 05/10/2023 0331   CO2 20 (L) 05/10/2023 0331   GLUCOSE 131 (H) 05/10/2023 0331   BUN 6 (L) 05/10/2023 0331   CREATININE 0.72 05/10/2023 0331   CALCIUM 7.8 (L) 05/10/2023 0331   PROT 7.1 05/09/2023 0326   ALBUMIN 2.8 (L) 05/09/2023 0326   AST 20 05/09/2023 0326   ALT 23 05/09/2023 0326   ALKPHOS 55 05/09/2023 0326   BILITOT 0.6 05/09/2023 0326   GFRNONAA >60 05/10/2023 0331   Lipase     Component Value Date/Time   LIPASE 36 05/05/2023 1745       Studies/Results: DG Abd Portable 1V  Result Date: 05/09/2023 CLINICAL DATA:  Small bowel obstruction. EXAM: PORTABLE ABDOMEN - 1 VIEW COMPARISON:  05/07/2023 FINDINGS: NG tube tip remains in the mid stomach. The volume  of small bowel gas appears decreased in the interval with most distended gas-filled small bowel with today measuring about 3.2 cm. Colon is relatively gasless. IMPRESSION: Interval decrease in small bowel gas with most distended gas-filled small bowel today measuring about 3.2 cm. Electronically Signed   By: Kennith Center M.D.   On: 05/09/2023 10:07    Anti-infectives: Anti-infectives (From admission, onward)    Start     Dose/Rate Route Frequency Ordered Stop   05/09/23 0930  cefoTEtan (CEFOTAN) 1 g in sodium chloride 0.9 % 100 mL IVPB        1 g 200 mL/hr over 30 Minutes Intravenous On call to O.R. 05/09/23 0843 05/09/23 1355      Assessment/Plan POD 1, s/p dx lap with LOA of 1 adhesive band forSBO, by Dr. Bedelia Person 05/09/23 - cont NGT and await bowel function -mobilize, pulm toilet -multi-modal pain control  FEN - NPO, NGT, IVFs VTE - Lovenox ID -  none needed  Jehovah's Witness   LOS: 5 days   Letha Cape, [facility] Please see Amion for pager number during day hours 7:00am-4:30pm

## 2023-05-10 NOTE — Plan of Care (Signed)
  Problem: Clinical Measurements: Goal: Cardiovascular complication will be avoided Outcome: Progressing   Problem: Activity: Goal: Risk for activity intolerance will decrease Outcome: Progressing   Problem: Nutrition: Goal: Adequate nutrition will be maintained Outcome: Progressing   Problem: Clinical Measurements: Goal: Will remain free from infection Outcome: Progressing   Problem: Clinical Measurements: Goal: Ability to maintain clinical measurements within normal limits will improve Outcome: Progressing

## 2023-05-11 NOTE — Progress Notes (Signed)
NGT still clamped. Pt tolerating sips of clears. Will continue to monitor pt.

## 2023-05-11 NOTE — Progress Notes (Addendum)
NGT clamped. Pt tolerating well. Will continue monitoring pt.

## 2023-05-11 NOTE — Progress Notes (Signed)
[facility] Progress Note  2 Days Post-Op  Subjective: No bowel function yet.  Feels some rumbling, NGT clamped with no nausea while walking.  Walking multiple laps.  Pain well controlled.   Objective: Vital signs in last 24 hours: Temp:  [98.2 F (36.8 C)-98.6 F (37 C)] 98.2 F (36.8 C) (07/09 0910) Pulse Rate:  [95-100] 100 (07/09 0910) Resp:  [16-19] 19 (07/09 0910) BP: (129-139)/(78-99) 139/99 (07/09 0910) SpO2:  [95 %-99 %] 99 % (07/09 0910)    Intake/Output from previous day: 07/08 0701 - 07/09 0700 In: 3845 [P.O.:20; I.V.:3765; NG/GT:60] Out: 900 [Emesis/NG output:900] Intake/Output this shift: No intake/output data recorded.  PE: Abd: soft, appropriately tender, incisions c/d/I, NGT with some bilious output, 900cc documented yesterday, minimal today and overnight.  Some BS  Lab Results:  Recent Labs    05/09/23 0326 05/10/23 0331  WBC 8.9 9.9  HGB 11.8* 11.7*  HCT 37.1 35.6*  PLT 214 132*   BMET Recent Labs    05/09/23 0326 05/10/23 0331  NA 146* 142  K 3.8 4.1  CL 114* 112*  CO2 23 20*  GLUCOSE 138* 131*  BUN 12 6*  CREATININE 0.79 0.72  CALCIUM 8.5* 7.8*   PT/INR No results for input(s): "LABPROT", "INR" in the last 72 hours. CMP     Component Value Date/Time   NA 142 05/10/2023 0331   K 4.1 05/10/2023 0331   CL 112 (H) 05/10/2023 0331   CO2 20 (L) 05/10/2023 0331   GLUCOSE 131 (H) 05/10/2023 0331   BUN 6 (L) 05/10/2023 0331   CREATININE 0.72 05/10/2023 0331   CALCIUM 7.8 (L) 05/10/2023 0331   PROT 7.1 05/09/2023 0326   ALBUMIN 2.8 (L) 05/09/2023 0326   AST 20 05/09/2023 0326   ALT 23 05/09/2023 0326   ALKPHOS 55 05/09/2023 0326   BILITOT 0.6 05/09/2023 0326   GFRNONAA >60 05/10/2023 0331   Lipase     Component Value Date/Time   LIPASE 36 05/05/2023 1745       Studies/Results: No results found.  Anti-infectives: Anti-infectives (From admission, onward)    Start     Dose/Rate Route Frequency Ordered Stop    05/09/23 0930  cefoTEtan (CEFOTAN) 1 g in sodium chloride 0.9 % 100 mL IVPB        1 g 200 mL/hr over 30 Minutes Intravenous On call to O.R. 05/09/23 0843 05/09/23 1355      Assessment/Plan POD 2, s/p dx lap with LOA of 1 adhesive band forSBO, by Dr. Bedelia Person 05/09/23 - will clamp NGT today and allow some ice and sips.  Return to suction if develops N/V, but if not hopefully can remove tomorrow -mobilize, pulm toilet -multi-modal pain control  FEN - NPO, NGT clamped with sips of liquids, IVFs VTE - Lovenox ID -  none needed  Jehovah's Witness   LOS: 6 days   Letha Cape, [facility] Please see Amion for pager number during day hours 7:00am-4:30pm

## 2023-05-12 ENCOUNTER — Inpatient Hospital Stay (HOSPITAL_COMMUNITY): Payer: Medicaid Other

## 2023-05-12 DIAGNOSIS — M7989 Other specified soft tissue disorders: Secondary | ICD-10-CM

## 2023-05-12 LAB — GLUCOSE, CAPILLARY: Glucose-Capillary: 104 mg/dL — ABNORMAL HIGH (ref 70–99)

## 2023-05-12 MED ORDER — ASPIRIN 81 MG PO CHEW
324.0000 mg | CHEWABLE_TABLET | Freq: Every day | ORAL | Status: DC
  Administered 2023-05-12 – 2023-05-13 (×2): 324 mg via ORAL
  Filled 2023-05-12 (×2): qty 4

## 2023-05-12 MED ORDER — BOOST / RESOURCE BREEZE PO LIQD CUSTOM
1.0000 | Freq: Three times a day (TID) | ORAL | Status: DC
  Administered 2023-05-12: 1 via ORAL

## 2023-05-12 MED ORDER — ADULT MULTIVITAMIN W/MINERALS CH
1.0000 | ORAL_TABLET | Freq: Every day | ORAL | Status: DC
  Administered 2023-05-12 – 2023-05-15 (×4): 1 via ORAL
  Filled 2023-05-12 (×4): qty 1

## 2023-05-12 NOTE — Progress Notes (Signed)
Left upper extremity venous study completed.   Preliminary results relayed to RN.  Please see CV Procedures for preliminary results.  Adrea Sherpa, RVT  3:13 PM 05/12/23

## 2023-05-12 NOTE — Progress Notes (Signed)
NGT out per E simman,PA. Pt tolerated well

## 2023-05-12 NOTE — Progress Notes (Signed)
Left arm had two PIVs in it with continuous fluids running in one. Both started leaking overnight and were removed. Left arm is swollen and very tender from above IV sites. Message left with on call provider service for [facility]; awaiting feedback.

## 2023-05-12 NOTE — Progress Notes (Addendum)
3 Days Post-Op   Subjective/Chief Complaint: Patient developed infiltration of left arm from IV  NG tube clamped and nausea vomiting overnight  No flatus yet   Objective: Vital signs in last 24 hours: Temp:  [98.2 F (36.8 C)-98.4 F (36.9 C)] 98.3 F (36.8 C) (07/10 0410) Pulse Rate:  [81-103] 103 (07/10 0410) Resp:  [18-19] 18 (07/10 0410) BP: (139-156)/(78-99) 156/86 (07/10 0410) SpO2:  [99 %-100 %] 100 % (07/10 0410)    Intake/Output from previous day: 07/09 0701 - 07/10 0700 In: 90 [NG/GT:90] Out: 300 [Emesis/NG output:300] Intake/Output this shift: No intake/output data recorded.      abd: soft, appropriately tender, incisions c/d/I, NGT clamped minimal today and overnight.  Some BS   Left upper extremity forearm shows infiltration of IV.  The swelling of this area.  No redness or fluctuance.   Lab Results:  Recent Labs    05/10/23 0331  WBC 9.9  HGB 11.7*  HCT 35.6*  PLT 132*   BMET Recent Labs    05/10/23 0331  NA 142  K 4.1  CL 112*  CO2 20*  GLUCOSE 131*  BUN 6*  CREATININE 0.72  CALCIUM 7.8*   PT/INR No results for input(s): "LABPROT", "INR" in the last 72 hours. ABG No results for input(s): "PHART", "HCO3" in the last 72 hours.  Invalid input(s): "PCO2", "PO2"  Studies/Results: No results found.  Anti-infectives: Anti-infectives (From admission, onward)    Start     Dose/Rate Route Frequency Ordered Stop   05/09/23 0930  cefoTEtan (CEFOTAN) 1 g in sodium chloride 0.9 % 100 mL IVPB        1 g 200 mL/hr over 30 Minutes Intravenous On call to O.R. 05/09/23 0843 05/09/23 1355       Assessment/Plan: s/p POD 3, s/p dx lap with LOA of 1 adhesive band forSBO, by Dr. Bedelia Person 05/09/23 - clamp NGT  and allow some ice and sips.  Due to nausea and vomiting today, DC NG tube and continue ice chips for today     -multi-modal pain control   FEN - NPO, NGT clamped with sips of liquids, IVFs VTE - Lovenox ID -  none needed    Jehovah's Witness    Check left upper extremity duplex  LOS: 7 days    Sharon Berry 05/12/2023

## 2023-05-12 NOTE — Progress Notes (Signed)
Initial Nutrition Assessment  DOCUMENTATION CODES:   Not applicable  INTERVENTION:  Recommend initiating TPN if unable to advance diet beyond full liquids in the next 24-48 hrs Boost Breeze po TID, each supplement provides 250 kcal and 9 grams of protein Multivitamin w/ minerals daily  NUTRITION DIAGNOSIS:  Inadequate oral intake related to acute illness (SBO) as evidenced by NPO status.  GOAL:  Patient will meet greater than or equal to 90% of their needs  MONITOR:  PO intake, Supplement acceptance, Diet advancement, Labs, Weight trends  REASON FOR ASSESSMENT:  Malnutrition Screening Tool, NPO/Clear Liquid Diet    ASSESSMENT:  62 y.o. presented to the ED with abdominal pain, nausea, and vomiting. PMH includes HLD. Pt admitted with small bowel obstruction.   7/03 - Admitted; NG placed; NPO  7/07 - Op, s/p laparoscopy, lysis of adhesive band 7/10 - NGT removed; diet advanced to clear liquids  Pt reports a normal appetite PTA, reports was never a big eater and did not notice a decrease in her PO intake. Reports typically eating a salad for lunch and then supper would vary. Denies any current nausea or vomiting, hoping that NG-tube would be removed soon. RD reached out to team, NG ok to be removed. Pt reports having flatus, but has not yet had a bowel movement. RD encouraged pt to mobilized as much as possible. Pt expressed being tired and weak.  Pt reports having weighted about 180# at her highest and was trying to lose weight. Then started to lose weight more rapidly, but was unsure what had changed. Reports that she had been stable around 148-150#, but is now down to 144# since being here.   Discussed needs for nutrition since she is at LOS 7. Pt agreeable to trying Boost Breeze once diet was advanced.   Medications reviewed and include: Protonix Labs reviewed.  NUTRITION - FOCUSED PHYSICAL EXAM:  Flowsheet Row Most Recent Value  Orbital Region No depletion  Upper Arm  Region No depletion  Thoracic and Lumbar Region No depletion  Buccal Region No depletion  Temple Region No depletion  Clavicle Bone Region Mild depletion  Clavicle and Acromion Bone Region Mild depletion  Scapular Bone Region Mild depletion  Dorsal Hand Mild depletion  Patellar Region No depletion  Anterior Thigh Region No depletion  Posterior Calf Region No depletion  Edema (RD Assessment) None  Hair Reviewed  Eyes Reviewed  Mouth Reviewed  Skin Reviewed  Nails Reviewed   Diet Order:   Diet Order             Diet clear liquid Room service appropriate? Yes; Fluid consistency: Thin  Diet effective now                   EDUCATION NEEDS:   Education needs have been addressed  Skin:  Skin Assessment: Reviewed RN Assessment  Last BM:  Unknown  Height:  Ht Readings from Last 1 Encounters:  05/09/23 5' 7.01" (1.702 m)   Weight:  Wt Readings from Last 1 Encounters:  05/09/23 65.6 kg   Ideal Body Weight:  61.4 kg  BMI:  Body mass index is 22.65 kg/m.  Estimated Nutritional Needs:  Kcal:  1800-2000 Protein:  90-110 grams Fluid:  >/= 2 L   Kirby Crigler RD, LDN Clinical Dietitian See [facility] for contact information.

## 2023-05-13 LAB — BASIC METABOLIC PANEL
Anion gap: 12 (ref 5–15)
BUN: <5 mg/dL — ABNORMAL LOW (ref 8–23)
CO2: 20 mmol/L — ABNORMAL LOW (ref 22–32)
Calcium: 8.1 mg/dL — ABNORMAL LOW (ref 8.9–10.3)
Chloride: 103 mmol/L (ref 98–111)
Creatinine, Ser: 0.76 mg/dL (ref 0.44–1.00)
GFR, Estimated: >60 mL/min (ref >60)
Glucose, Bld: 114 mg/dL — ABNORMAL HIGH (ref 70–99)
Potassium: 3.3 mmol/L — ABNORMAL LOW (ref 3.5–5.1)
Sodium: 135 mmol/L (ref 135–145)

## 2023-05-13 LAB — CBC
HCT: 34.1 % — ABNORMAL LOW (ref 36.0–46.0)
Hemoglobin: 11.3 g/dL — ABNORMAL LOW (ref 12.0–15.0)
MCH: 31.2 pg (ref 26.0–34.0)
MCHC: 33.1 g/dL (ref 30.0–36.0)
MCV: 94.2 fL (ref 80.0–100.0)
Platelets: 155 10*3/uL (ref 150–400)
RBC: 3.62 MIL/uL — ABNORMAL LOW (ref 3.87–5.11)
RDW: 12.5 % (ref 11.5–15.5)
WBC: 10 10*3/uL (ref 4.0–10.5)
nRBC: 0 % (ref 0.0–0.2)

## 2023-05-13 LAB — GLUCOSE, CAPILLARY: Glucose-Capillary: 101 mg/dL — ABNORMAL HIGH (ref 70–99)

## 2023-05-13 MED ORDER — POTASSIUM CHLORIDE CRYS ER 20 MEQ PO TBCR
40.0000 meq | EXTENDED_RELEASE_TABLET | Freq: Two times a day (BID) | ORAL | Status: AC
  Administered 2023-05-13 (×2): 40 meq via ORAL
  Filled 2023-05-13 (×2): qty 2

## 2023-05-13 MED ORDER — METHOCARBAMOL 500 MG PO TABS
500.0000 mg | ORAL_TABLET | Freq: Three times a day (TID) | ORAL | Status: DC | PRN
  Administered 2023-05-13 – 2023-05-14 (×2): 500 mg via ORAL
  Filled 2023-05-13 (×2): qty 1

## 2023-05-13 MED ORDER — TRAMADOL HCL 50 MG PO TABS
50.0000 mg | ORAL_TABLET | Freq: Four times a day (QID) | ORAL | Status: DC | PRN
  Administered 2023-05-13 – 2023-05-15 (×2): 50 mg via ORAL
  Filled 2023-05-13 (×2): qty 1

## 2023-05-13 MED ORDER — ACETAMINOPHEN 500 MG PO TABS
1000.0000 mg | ORAL_TABLET | Freq: Four times a day (QID) | ORAL | Status: DC | PRN
  Administered 2023-05-14: 1000 mg via ORAL
  Filled 2023-05-13: qty 2

## 2023-05-13 NOTE — Plan of Care (Signed)

## 2023-05-13 NOTE — Progress Notes (Signed)
4 Days Post-Op   Subjective/Chief Complaint: +flatus, also had a small nonbloody BM   Objective: Vital signs in last 24 hours: Temp:  [98.4 F (36.9 C)-99.4 F (37.4 C)] 99 F (37.2 C) (07/11 0803) Pulse Rate:  [82-103] 103 (07/11 0803) Resp:  [18] 18 (07/11 0803) BP: (141-153)/(80-85) 141/85 (07/11 0803) SpO2:  [98 %-100 %] 100 % (07/11 0803)    Intake/Output from previous day: 07/10 0701 - 07/11 0700 In: 2909.4 [P.O.:577; I.V.:2332.4] Out: -  Intake/Output this shift: Total I/O In: 240 [P.O.:240] Out: -    abd: soft, appropriately tender, incisions c/d/I, mild distention   Left upper extremity forearm shows infiltration of IV.  The swelling of this area.  No redness or fluctuance.   Lab Results:  No results for input(s): "WBC", "HGB", "HCT", "PLT" in the last 72 hours.  BMET No results for input(s): "NA", "K", "CL", "CO2", "GLUCOSE", "BUN", "CREATININE", "CALCIUM" in the last 72 hours.  PT/INR No results for input(s): "LABPROT", "INR" in the last 72 hours. ABG No results for input(s): "PHART", "HCO3" in the last 72 hours.  Invalid input(s): "PCO2", "PO2"  Studies/Results: VAS [place] UPPER EXTREMITY VENOUS DUPLEX  Result Date: 05/12/2023 UPPER VENOUS STUDY  Patient Name:  Sharon Berry  Date of Exam:   05/12/2023 Medical Rec #: [id number]               Accession #:    [id number] Date of Birth: [birth year]               Patient Gender: F Patient Age:   62 years Exam Location:  [facility] Procedure:      VAS [place] UPPER EXTREMITY VENOUS DUPLEX Referring Phys: Maisie Fus CORNETT --------------------------------------------------------------------------------  Indications: Swelling, and multuple recent IV sticks Comparison Study: No previous study. Performing Technologist: McKayla Maag RVT, [place]  Examination Guidelines: A complete evaluation includes B-mode imaging, spectral Doppler, color Doppler, and power Doppler as needed of all accessible portions of each vessel.  Bilateral testing is considered an integral part of a complete examination. Limited examinations for reoccurring indications may be performed as noted.  Right Findings: +----------+------------+---------+-----------+----------+-------+ RIGHT     CompressiblePhasicitySpontaneousPropertiesSummary +----------+------------+---------+-----------+----------+-------+ Subclavian               Yes       Yes                      +----------+------------+---------+-----------+----------+-------+  Left Findings: +----------+------------+---------+-----------+----------+-------+ LEFT      CompressiblePhasicitySpontaneousPropertiesSummary +----------+------------+---------+-----------+----------+-------+ IJV           Full       Yes       Yes                      +----------+------------+---------+-----------+----------+-------+ Subclavian    Full       Yes       Yes                      +----------+------------+---------+-----------+----------+-------+ Axillary      Full       Yes       Yes                      +----------+------------+---------+-----------+----------+-------+ Brachial      Full       Yes       Yes                      +----------+------------+---------+-----------+----------+-------+  Radial        Full                                          +----------+------------+---------+-----------+----------+-------+ Ulnar         Full                                          +----------+------------+---------+-----------+----------+-------+ Cephalic      None       No        No                Acute  +----------+------------+---------+-----------+----------+-------+ Basilic       None       No        No                Acute  +----------+------------+---------+-----------+----------+-------+  Summary:  Right: No evidence of thrombosis in the subclavian.  Left: No evidence of deep vein thrombosis in the upper extremity. Findings consistent with  acute superficial vein thrombosis involving the left basilic vein, mid upper arm to proximal forearm, and left cephalic vein, AC fossa to wrist.  *See table(s) above for measurements and observations.  Diagnosing physician: Heath Lark Electronically signed by Heath Lark on 05/12/2023 at 5:46:12 PM.    Final     Anti-infectives: Anti-infectives (From admission, onward)    Start     Dose/Rate Route Frequency Ordered Stop   05/09/23 0930  cefoTEtan (CEFOTAN) 1 g in sodium chloride 0.9 % 100 mL IVPB        1 g 200 mL/hr over 30 Minutes Intravenous On call to O.R. 05/09/23 0843 05/09/23 1355       Assessment/Plan: s/p POD 4, s/p dx lap with LOA of 1 adhesive band forSBO, by Dr. Bedelia Person 05/09/23 - bowel function returning, advance diet to soft (has a lactose allergy so skipped FLD and encouraged patient to take it slow with solids, go back to liquids if develops nausea, distention, etc). -multi-modal pain control   FEN - NPO, NGT clamped with sips of liquids, IVFs VTE - Lovenox ID -  none needed    LUE swelling, suspect Infiltration of LUE IV- doppler negative for DVT. Superficial thrombophlebitis >> heat, ASA Jehovah's Witness      LOS: 8 days    Sharon Berry 05/13/2023

## 2023-05-14 ENCOUNTER — Inpatient Hospital Stay (HOSPITAL_COMMUNITY): Payer: Medicaid Other

## 2023-05-14 ENCOUNTER — Other Ambulatory Visit (HOSPITAL_COMMUNITY): Payer: Self-pay

## 2023-05-14 DIAGNOSIS — R079 Chest pain, unspecified: Secondary | ICD-10-CM | POA: Diagnosis not present

## 2023-05-14 DIAGNOSIS — I2699 Other pulmonary embolism without acute cor pulmonale: Secondary | ICD-10-CM | POA: Diagnosis not present

## 2023-05-14 DIAGNOSIS — R000 Tachycardia, unspecified: Secondary | ICD-10-CM | POA: Diagnosis not present

## 2023-05-14 DIAGNOSIS — J9811 Atelectasis: Secondary | ICD-10-CM | POA: Diagnosis not present

## 2023-05-14 MED ORDER — APIXABAN 5 MG PO TABS: 5.0000 mg | ORAL_TABLET | Freq: Two times a day (BID) | ORAL | Status: DC

## 2023-05-14 MED ORDER — APIXABAN 5 MG PO TABS
10.0000 mg | ORAL_TABLET | Freq: Two times a day (BID) | ORAL | Status: DC
  Administered 2023-05-14 – 2023-05-15 (×2): 10 mg via ORAL
  Filled 2023-05-14 (×2): qty 2

## 2023-05-14 MED ORDER — METHOCARBAMOL 500 MG PO TABS
500.0000 mg | ORAL_TABLET | Freq: Three times a day (TID) | ORAL | Status: DC
  Administered 2023-05-14 – 2023-05-15 (×4): 500 mg via ORAL
  Filled 2023-05-14 (×4): qty 1

## 2023-05-14 MED ORDER — IOHEXOL 350 MG/ML SOLN
65.0000 mL | Freq: Once | INTRAVENOUS | Status: AC | PRN
  Administered 2023-05-14: 65 mL via INTRAVENOUS

## 2023-05-14 MED ORDER — KETOROLAC TROMETHAMINE 15 MG/ML IJ SOLN
15.0000 mg | Freq: Once | INTRAMUSCULAR | Status: AC
  Administered 2023-05-14: 15 mg via INTRAVENOUS
  Filled 2023-05-14: qty 1

## 2023-05-14 NOTE — Progress Notes (Signed)
5 Days Post-Op   Subjective/Chief Complaint:  Cc LUQ/left chest wall pain worse with movement or inspiration that started last night. Pain slightly improves with robaxin.  Denies nausea or vomiting. Tolerating PO. Reports a lot of  flatus and a substantial BM around 11 PM.    Objective: Vital signs in last 24 hours: Temp:  [97.9 F (36.6 C)-99.3 F (37.4 C)] 97.9 F (36.6 C) (07/12 0749) Pulse Rate:  [99-108] 104 (07/12 0749) Resp:  [16-20] 16 (07/12 0749) BP: (117-139)/(75-86) 117/81 (07/12 0749) SpO2:  [96 %-100 %] 96 % (07/12 0749) Last BM Date : 05/13/23  Intake/Output from previous day: 07/11 0701 - 07/12 0700 In: 657 [P.O.:480; I.V.:177] Out: -  Intake/Output this shift: No intake/output data recorded.   abd: soft, appropriately tender, incisions c/d/I, mild distention that is stable to improved There is no  chest wall pain to palpation.   Lab Results:  Recent Labs    05/13/23 1036  WBC 10.0  HGB 11.3*  HCT 34.1*  PLT 155    BMET Recent Labs    05/13/23 1036  NA 135  K 3.3*  CL 103  CO2 20*  GLUCOSE 114*  BUN <5*  CREATININE 0.76  CALCIUM 8.1*    PT/INR No results for input(s): "LABPROT", "INR" in the last 72 hours. ABG No results for input(s): "PHART", "HCO3" in the last 72 hours.  Invalid input(s): "PCO2", "PO2"  Studies/Results: VAS [place] UPPER EXTREMITY VENOUS DUPLEX  Result Date: 05/12/2023 UPPER VENOUS STUDY  Patient Name:  AKSHITHA HUPPE  Date of Exam:   05/12/2023 Medical Rec #: [id number]               Accession #:    [id number] Date of Birth: 24-Jun-1961               Patient Gender: F Patient Age:   62 years Exam Location:  [facility] Procedure:      VAS [place] UPPER EXTREMITY VENOUS DUPLEX Referring Phys: Maisie Fus CORNETT --------------------------------------------------------------------------------  Indications: Swelling, and multuple recent IV sticks Comparison Study: No previous study. Performing Technologist: McKayla Maag  RVT, [place]  Examination Guidelines: A complete evaluation includes B-mode imaging, spectral Doppler, color Doppler, and power Doppler as needed of all accessible portions of each vessel. Bilateral testing is considered an integral part of a complete examination. Limited examinations for reoccurring indications may be performed as noted.  Right Findings: +----------+------------+---------+-----------+----------+-------+ RIGHT     CompressiblePhasicitySpontaneousPropertiesSummary +----------+------------+---------+-----------+----------+-------+ Subclavian               Yes       Yes                      +----------+------------+---------+-----------+----------+-------+  Left Findings: +----------+------------+---------+-----------+----------+-------+ LEFT      CompressiblePhasicitySpontaneousPropertiesSummary +----------+------------+---------+-----------+----------+-------+ IJV           Full       Yes       Yes                      +----------+------------+---------+-----------+----------+-------+ Subclavian    Full       Yes       Yes                      +----------+------------+---------+-----------+----------+-------+ Axillary      Full       Yes       Yes                      +----------+------------+---------+-----------+----------+-------+  Brachial      Full       Yes       Yes                      +----------+------------+---------+-----------+----------+-------+ Radial        Full                                          +----------+------------+---------+-----------+----------+-------+ Ulnar         Full                                          +----------+------------+---------+-----------+----------+-------+ Cephalic      None       No        No                Acute  +----------+------------+---------+-----------+----------+-------+ Basilic       None       No        No                Acute   +----------+------------+---------+-----------+----------+-------+  Summary:  Right: No evidence of thrombosis in the subclavian.  Left: No evidence of deep vein thrombosis in the upper extremity. Findings consistent with acute superficial vein thrombosis involving the left basilic vein, mid upper arm to proximal forearm, and left cephalic vein, AC fossa to wrist.  *See table(s) above for measurements and observations.  Diagnosing physician: Heath Lark Electronically signed by Heath Lark on 05/12/2023 at 5:46:12 PM.    Final     Anti-infectives: Anti-infectives (From admission, onward)    Start     Dose/Rate Route Frequency Ordered Stop   05/09/23 0930  cefoTEtan (CEFOTAN) 1 g in sodium chloride 0.9 % 100 mL IVPB        1 g 200 mL/hr over 30 Minutes Intravenous On call to O.R. 05/09/23 0843 05/09/23 1355       Assessment/Plan: s/p POD 5, s/p dx lap with LOA of 1 adhesive band forSBO, by Dr. Bedelia Person 05/09/23 - ileus resolving, tolerating soft diet  - new LUQ/left chest wall pain may be musculoskeletal vs gas pain. No oxygen requirement. Intermittent tachycardia 104-108 that I suspect is pain related. Apply heat and mobilize this morning. Failure to improve or if sxs worsen then ill check a KUB/CXR, for worsening tachycardia or respiratory sxs would consider CT angio of the chest to r/o PE, low suspicion for PE at this time.   - possible afternoon discharge if pain controlled.    FEN - diet soft VTE - Lovenox ID -  none needed    Superficial thrombophlebitis - heat, monitor Jehovah's Witness      LOS: 9 days    Adam Phenix 05/14/2023

## 2023-05-14 NOTE — Progress Notes (Addendum)
ANTICOAGULATION CONSULT NOTE - Initial Consult  Pharmacy Consult for Anticoagulation Indication: pulmonary embolus  Allergies  Allergen Reactions   Spinach Other (See Comments)    Constipation     Patient Measurements: Height: 5' 7.01" (170.2 cm) Weight: 65.6 kg (144 lb 10 oz) IBW/kg (Calculated) : 61.62  Vital Signs: Temp: 98.4 F (36.9 C) (07/12 1221) Temp Source: Oral (07/12 1221) BP: 125/72 (07/12 1221) Pulse Rate: 94 (07/12 1221)  Labs: Recent Labs    05/13/23 1036  HGB 11.3*  HCT 34.1*  PLT 155  CREATININE 0.76    Estimated Creatinine Clearance: 71.8 mL/min (by C-G formula based on SCr of 0.76 mg/dL).   Medical History: Past Medical History:  Diagnosis Date   Allergy    Arthritis    Depression    Hyperlipidemia     Medications:  Medications Prior to Admission  Medication Sig Dispense Refill Last Dose   acetaminophen (TYLENOL) 500 MG tablet Take 500-1,000 mg by mouth every 6 (six) hours as needed for moderate pain.   Past Week   ondansetron (ZOFRAN-ODT) 4 MG disintegrating tablet Take 4 mg by mouth every 8 (eight) hours as needed for nausea or vomiting.   05/05/2023    Assessment: Patient is a 62 yo female s/p LOA of 1 adhesive band for SBO on 05/09/23. She is POD5. New complants of LUQ/chest wall pain with tacychardia. CTA 7/12 revealed small peripheral pulmonary emboli in lower lobe branches of the left pulmonary artery. Pharmacy has been consulted to dose anticoagulation for PE.  Goal of Therapy:  Monitor platelets by anticoagulation protocol: Yes   Plan:  Initiate apixaban 10 mg BID x 7 days, followed by 5 mg BID thereafter. Will monitor CBC, and signs or symptoms of bleeding Will d/c prophylactic lovenox F/u copay  Dalene Carrow 05/14/2023,3:11 PM  I discussed / reviewed the pharmacy note by Dr. Tilda Burrow and I agree with the resident's findings and plans as documented.  Copay for Eliquis is $4.00/month   Calton Dach, PharmD Clinical  Pharmacist 05/14/2023 3:23 PM

## 2023-05-14 NOTE — TOC Benefit Eligibility Note (Signed)
Pharmacy Patient Advocate Encounter  Insurance verification completed.    The patient is insured through [employer] [place] [employer]ICA[employer]   Ran test claim for Eliquis 5 mg and the current 30 day co-pay is $4.00.   This test claim was processed through [facility]- copay amounts may vary at other pharmacies due to pharmacy/plan contracts, or as the patient moves through the different stages of their insurance plan.    Roland Earl, CPHT Pharmacy Patient Advocate Specialist [facility] Patient Advocate Team Direct Number: [phone]  Fax: [phone]

## 2023-05-14 NOTE — Discharge Instructions (Addendum)
LAPAROSCOPIC SURGERY: POST OP INSTRUCTIONS Always review your discharge instruction sheet given to you by the facility where your surgery was performed. IF YOU HAVE DISABILITY OR FAMILY LEAVE FORMS, YOU MUST BRING THEM TO THE OFFICE FOR PROCESSING.   DO NOT GIVE THEM TO YOUR DOCTOR.  PAIN CONTROL  First take acetaminophen (Tylenol) AND/or ibuprofen (Advil) to control your pain after surgery.  Follow directions on package.  Taking acetaminophen (Tylenol) and/or ibuprofen (Advil) regularly after surgery will help to control your pain and lower the amount of prescription pain medication you may need.  You should not take more than 3,000 mg (3 grams) of acetaminophen (Tylenol) in 24 hours.  You should not take ibuprofen (Advil), aleve, motrin, naprosyn or other NSAIDS if you have a history of stomach ulcers or chronic kidney disease.  A prescription for pain medication may be given to you upon discharge.  Take your pain medication as prescribed, if you still have uncontrolled pain after taking acetaminophen (Tylenol) or ibuprofen (Advil). Use ice packs to help control pain. If you need a refill on your pain medication, please contact your pharmacy.  They will contact our office to request authorization. Prescriptions will not be filled after 5pm or on week-ends.  HOME MEDICATIONS Take your usually prescribed medications unless otherwise directed.  DIET You should follow a light diet the first few days after arrival home.  Be sure to include lots of fluids daily. Avoid fatty, fried foods.   CONSTIPATION It is common to experience some constipation after surgery and if you are taking pain medication.  Increasing fluid intake and taking a stool softener (such as Colace) will usually help or prevent this problem from occurring.  A mild laxative (Milk of Magnesia or Miralax) should be taken according to package instructions if there are no bowel movements after 48 hours.  WOUND/INCISION CARE Most  patients will experience some swelling and bruising in the area of the incisions.  Ice packs will help.  Swelling and bruising can take several days to resolve.  Unless discharge instructions indicate otherwise, follow guidelines below  STERI-STRIPS - you may remove your outer bandages 48 hours after surgery, and you may shower at that time.  You have steri-strips (small skin tapes) in place directly over the incision.  These strips should be left on the skin for 7-10 days.   DERMABOND/SKIN GLUE - you may shower in 24 hours.  The glue will flake off over the next 2-3 weeks. Any sutures or staples will be removed at the office during your follow-up visit.  ACTIVITIES You may resume regular (light) daily activities beginning the next day--such as daily self-care, walking, climbing stairs--gradually increasing activities as tolerated.  You may have sexual intercourse when it is comfortable.  Refrain from any heavy lifting or straining until approved by your doctor. You may drive when you are no longer taking prescription pain medication, you can comfortably wear a seatbelt, and you can safely maneuver your car and apply brakes.  FOLLOW-UP You should see your doctor in the office for a follow-up appointment approximately 2-3 weeks after your surgery.  You should have been given your post-op/follow-up appointment when your surgery was scheduled.  If you did not receive a post-op/follow-up appointment, make sure that you call for this appointment within a day or two after you arrive home to insure a convenient appointment time.  OTHER INSTRUCTIONS   WHEN TO CALL YOUR DOCTOR: Fever over 101.0 Inability to urinate Continued bleeding from incision. Increased pain,  redness, or drainage from the incision. Increasing abdominal pain  The clinic staff is available to answer your questions during regular business hours.  Please don't hesitate to call and ask to speak to one of the nurses for clinical  concerns.  If you have a medical emergency, go to the nearest emergency room or call 911.  A surgeon from [facility] is always on call at the hospital. [address], [place], [place]  [address] ? [address] [address], [place], [place]   [address] [phone] ? [phone] ? FAX [phone] Web site: [email or web address]   Information on my medicine - ELIQUIS (apixaban)  Why was Eliquis prescribed for you? Eliquis was prescribed to treat blood clots that may have been found in the veins of your legs (deep vein thrombosis) or in your lungs (pulmonary embolism) and to reduce the risk of them occurring again.  What do You need to know about Eliquis ? The starting dose is 10 mg (two 5 mg tablets) taken TWICE daily for the FIRST SEVEN (7) DAYS, then on Friday 05/21/23 in evening the dose is reduced to ONE 5 mg tablet taken TWICE daily.  Eliquis may be taken with or without food.   Try to take the dose about the same time in the morning and in the evening. If you have difficulty swallowing the tablet whole please discuss with your pharmacist how to take the medication safely.  Take Eliquis exactly as prescribed and DO NOT stop taking Eliquis without talking to the doctor who prescribed the medication.  Stopping may increase your risk of developing a new blood clot.  Refill your prescription before you run out.  After discharge, you should have regular check-up appointments with your healthcare provider that is prescribing your Eliquis.    What do you do if you miss a dose? If a dose of ELIQUIS is not taken at the scheduled time, take it as soon as possible on the same day and twice-daily administration should be resumed. The dose should not be doubled to make up for a missed dose.  Important Safety Information A possible side effect of Eliquis is bleeding. You should call your healthcare provider right away if you experience any of the  following: Bleeding from an injury or your nose that does not stop. Unusual colored urine (red or dark brown) or unusual colored stools (red or black). Unusual bruising for unknown reasons. A serious fall or if you hit your head (even if there is no bleeding).  Some medicines may interact with Eliquis and might increase your risk of bleeding or clotting while on Eliquis. To help avoid this, consult your healthcare provider or pharmacist prior to using any new prescription or non-prescription medications, including herbals, vitamins, non-steroidal anti-inflammatory drugs (NSAIDs) and supplements.  This website has more information on Eliquis (apixaban): http://www.eliquis.com/eliquis/home

## 2023-05-14 NOTE — Progress Notes (Signed)
CTA reveals a small peripheral PE.  Will order venous duplex of BLE.  If these are positive she will be referred to the DVT clinic for further management.  If not, she will need to follow up with her PCP for her PE.  Pharmacy consult has been requested to dose this and start this.  Her lovenox can be DC once she has been started on her DOAC.  Letha Cape 3:11 PM 05/14/2023

## 2023-05-15 ENCOUNTER — Encounter (HOSPITAL_COMMUNITY): Payer: Managed Care, Other (non HMO)

## 2023-05-15 MED ORDER — TRAMADOL HCL 50 MG PO TABS: 50.0000 mg | ORAL_TABLET | Freq: Four times a day (QID) | ORAL | 0 refills | Status: DC | PRN

## 2023-05-15 MED ORDER — APIXABAN 5 MG PO TABS: ORAL_TABLET | ORAL | 3 refills | Status: DC

## 2023-05-15 NOTE — Progress Notes (Signed)
Pt discharged home in stable condition 

## 2023-05-15 NOTE — Progress Notes (Signed)
Assessment & Plan: POD#6 - s/p dx lap with LOA of 1 adhesive band for SBO, Dr. Bedelia Person 05/09/23 - tolerating soft diet, having BM's New DVT  - Eliquis per pharmacy consult  - plan DVT clinic referral  Plan discharge home today with family.        Darnell Level, MD [facility] practice Office: [phone]        Chief Complaint: SBO  Subjective: Patient up in bed, doing well, tolerating diet.  Family at bedside.  Objective: Vital signs in last 24 hours: Temp:  [98.1 F (36.7 C)-99.2 F (37.3 C)] 98.4 F (36.9 C) (07/13 0735) Pulse Rate:  [89-99] 89 (07/13 0735) Resp:  [16-17] 16 (07/13 0735) BP: (106-125)/(70-74) 106/72 (07/13 0735) SpO2:  [98 %-99 %] 99 % (07/13 0735) Last BM Date : 05/14/23  Intake/Output from previous day: 07/12 0701 - 07/13 0700 In: 180 [P.O.:180] Out: -  Intake/Output this shift: No intake/output data recorded.  Physical Exam: HEENT - sclerae clear, mucous membranes moist Abdomen - soft without distension Ext - no edema, non-tender Neuro - alert & oriented, no focal deficits  Lab Results:  Recent Labs    05/13/23 1036  WBC 10.0  HGB 11.3*  HCT 34.1*  PLT 155   BMET Recent Labs    05/13/23 1036  NA 135  K 3.3*  CL 103  CO2 20*  GLUCOSE 114*  BUN <5*  CREATININE 0.76  CALCIUM 8.1*   PT/INR No results for input(s): "LABPROT", "INR" in the last 72 hours. Comprehensive Metabolic Panel:    Component Value Date/Time   NA 135 05/13/2023 1036   NA 142 05/10/2023 0331   K 3.3 (L) 05/13/2023 1036   K 4.1 05/10/2023 0331   CL 103 05/13/2023 1036   CL 112 (H) 05/10/2023 0331   CO2 20 (L) 05/13/2023 1036   CO2 20 (L) 05/10/2023 0331   BUN <5 (L) 05/13/2023 1036   BUN 6 (L) 05/10/2023 0331   CREATININE 0.76 05/13/2023 1036   CREATININE 0.72 05/10/2023 0331   GLUCOSE 114 (H) 05/13/2023 1036   GLUCOSE 131 (H) 05/10/2023 0331   CALCIUM 8.1 (L) 05/13/2023 1036   CALCIUM 7.8 (L) 05/10/2023 0331   AST  20 05/09/2023 0326   AST 24 05/07/2023 0759   ALT 23 05/09/2023 0326   ALT 18 05/07/2023 0759   ALKPHOS 55 05/09/2023 0326   ALKPHOS 52 05/07/2023 0759   BILITOT 0.6 05/09/2023 0326   BILITOT 1.0 05/07/2023 0759   PROT 7.1 05/09/2023 0326   PROT 7.2 05/07/2023 0759   ALBUMIN 2.8 (L) 05/09/2023 0326   ALBUMIN 3.0 (L) 05/07/2023 0759    Studies/Results: CT Angio Chest Pulmonary Embolism (PE) W or WO Contrast  Addendum Date: 05/14/2023   ADDENDUM REPORT: 05/14/2023 14:34 ADDENDUM: Critical Value/emergent results were called by telephone at the time of interpretation on 05/14/2023 at 2:34 pm to provider [facility] , who verbally acknowledged these results. Electronically Signed   By: Lupita Raider M.D.   On: 05/14/2023 14:34   Addendum Date: 05/14/2023   ADDENDUM REPORT: 05/14/2023 14:30 ADDENDUM: Attempts to contact the ordering provider regarding this critical finding have been unsuccessful. These results will be called to the ordering clinician or representative by the Radiologist Assistant, and communication documented in the PACS or zVision Dashboard. Electronically Signed   By: Lupita Raider M.D.   On: 05/14/2023 14:30   Result Date: 05/14/2023 CLINICAL DATA:  Chest pain, tachycardia. EXAM:  CT ANGIOGRAPHY CHEST WITH CONTRAST TECHNIQUE: Multidetector CT imaging of the chest was performed using the standard protocol during bolus administration of intravenous contrast. Multiplanar CT image reconstructions and MIPs were obtained to evaluate the vascular anatomy. RADIATION DOSE REDUCTION: This exam was performed according to the departmental dose-optimization program which includes automated exposure control, adjustment of the mA and/or kV according to patient size and/or use of iterative reconstruction technique. CONTRAST:  65mL OMNIPAQUE IOHEXOL 350 MG/ML SOLN COMPARISON:  None Available. FINDINGS: Cardiovascular: Filling defects are noted in peripheral lower lobe branches of the left  pulmonary artery consistent with acute pulmonary emboli. Mild cardiomegaly is noted. No pericardial effusion is noted. Mediastinum/Nodes: No enlarged mediastinal, hilar, or axillary lymph nodes. Thyroid gland, trachea, and esophagus demonstrate no significant findings. Lungs/Pleura: No pneumothorax is noted. Right lung is clear. Small left pleural effusion is noted with associated left basilar atelectasis. Upper Abdomen: No acute abnormality. Musculoskeletal: No chest wall abnormality. No acute or significant osseous findings. Review of the MIP images confirms the above findings. IMPRESSION: Small peripheral pulmonary emboli are noted in lower lobe branches of the left pulmonary artery. Small left pleural effusion is noted with associated left basilar atelectasis. Electronically Signed: By: Lupita Raider M.D. On: 05/14/2023 14:13      Darnell Level 05/15/2023  Patient ID: Sharon Berry, female   DOB: [birth year], 62 y.o.   MRN: [id number]

## 2023-05-20 NOTE — Discharge Summary (Signed)
[facility] Discharge Summary   Patient ID: Sharon Berry MRN: [id number] DOB/AGE: 12-21-1960 62 y.o.  Admit date: 05/05/2023 Discharge date: 05/20/2023  Admitting Diagnosis: SBO  Discharge Diagnosis SBO  Consultants None  Imaging: No results found.  Procedures Dr. Bedelia Person (05/09/2023) - Diagnostic laparoscopy, lysis of single adhesive band   Hospital Course:  Sharon Berry is a 62 y.o. female who presented to the ED 05/05/23 for evaluation of abdominal pain, nausea, vomiting. She was found to have SBO. Patient was admitted to the medical service and NG tube placed. Patient failed conservative management and was taken to the OR 05/09/23 for diagnostic laparoscopy, lysis of single adhesive band . Tolerated procedure well and was transferred to the floor.  Once bowel function returned the NG tube was removed and diet advanced as tolerated. On POD6 the patient was having bowel function and tolerating diet and felt felt stable for discharge home.  Patient will follow up as below and knows to call with questions or concerns.     I was not directly involved in this patient's care therefore the information in this discharge summary was taken from the chart.    Allergies as of 05/15/2023       Reactions   Spinach Other (See Comments)   Constipation        Medication List     TAKE these medications    acetaminophen 500 MG tablet Commonly known as: TYLENOL Take 500-1,000 mg by mouth every 6 (six) hours as needed for moderate pain.   apixaban 5 MG Tabs tablet Commonly known as: ELIQUIS Take 2 tablets (10 mg total) by mouth 2 (two) times daily for 7 days, THEN 1 tablet (5 mg total) 2 (two) times daily. Start taking on: May 15, 2023   ondansetron 4 MG disintegrating tablet Commonly known as: ZOFRAN-ODT Take 4 mg by mouth every 8 (eight) hours as needed for nausea or vomiting.   traMADol 50 MG tablet Commonly known as: ULTRAM Take 1 tablet (50 mg  total) by mouth every 6 (six) hours as needed for moderate pain.               Discharge Care Instructions  (From admission, onward)           Start     Ordered   05/15/23 0000  No dressing needed        05/15/23 8657              Follow-up Information     Diamantina Monks, MD Follow up on 06/10/2023.   Specialty: Surgery Why: at 11:50am for post-operative follow up. please arrive 30 mins early. Contact information: [address] [place] [address] [facility] [place] [address] [phone]                  Signed: Franne Forts, [facility] 05/20/2023, 4:17 PM Please see Amion for pager number during day hours 7:00am-4:30pm

## 2023-05-24 ENCOUNTER — Ambulatory Visit (HOSPITAL_COMMUNITY)
Admission: RE | Admit: 2023-05-24 | Discharge: 2023-05-24 | Disposition: A | Discharge status: Home / Self Care | Payer: Medicaid Other | Source: Ambulatory Visit | Attending: Vascular Surgery | Admitting: Vascular Surgery

## 2023-05-24 ENCOUNTER — Encounter (HOSPITAL_COMMUNITY): Payer: Self-pay

## 2023-05-24 VITALS — BP 117/75 | HR 84

## 2023-05-24 DIAGNOSIS — I2699 Other pulmonary embolism without acute cor pulmonale: Secondary | ICD-10-CM | POA: Diagnosis not present

## 2023-05-24 DIAGNOSIS — I82890 Acute embolism and thrombosis of other specified veins: Secondary | ICD-10-CM | POA: Diagnosis not present

## 2023-05-24 MED ORDER — APIXABAN 5 MG PO TABS: 5.0000 mg | ORAL_TABLET | Freq: Two times a day (BID) | ORAL | 5 refills | Status: AC

## 2023-05-24 NOTE — Progress Notes (Signed)
DVT Clinic Note  Name: Sharon Berry     MRN: [id number]     DOB: September 30, 1961     Sex: female  PCP: Carmel Sacramento, NP  Today's Visit: Visit Information: Initial Visit  Referred to DVT Clinic by: Dr. Bedelia Person [facility] Surgery)  Referred to CPP by: Dr. Randie Heinz Reason for referral:  Chief Complaint  Patient presents with   Anticoagulation follow up   HISTORY OF PRESENT ILLNESS: Sharon Berry is a 62 y.o. female who presents for follow up medication management after diagnosis of PE and superficial vein thrombosis in the LUE during her recent hospitalization 7/3-7/13/24 at [facility] for small bowel obstruction. She had a procedure done on 05/09/23 by Dr. Bedelia Person. She was on VTE prophylaxis with Lovenox and was wearing SCDs during her hospitalization. She was mostly only getting out of bed to go to the bathroom. Throughout her stay she had multiple IVs placed in the LUE and started to have some pain in that arm. [place] 05/12/23 found superficial vein thrombosis involving the left basilic and cephalic veins. Shortly after this she then developed tachycardia, chest discomfort, and SOB, and CTA showed small peripheral pulmonary emboli. She was started on Eliquis and has been taking it since discharge. She was referred by Dr. Bedelia Person to the DVT Clinic for outpatient follow up.   Today patient reports the discomfort in her arm has resolved. She is no longer having any SOB, chest pain, or tachycardia. She does still feel more tired than usual. Denies abnormal bleeding or bruising. Denies missed doses of Eliquis. Last COVID infection was in March 2024. Family history of multiple PEs in her sister, but the patient has no prior history of VTE. Reports being up to date on cancer screenings. She does not currently have a PCP since her insurance changed. Her Medicaid card does list a PCP she is assigned to that she plans to call and schedule an appointment.   Positive Thrombotic Risk Factors: Recent  surgery (within 3 months), Recent admission to hospital with acute illness (within 3 months), Bed rest >72 hours within 3 month, Recent trauma (within 3 months) Bleeding Risk Factors: Anticoagulant therapy  Negative Thrombotic Risk Factors: Previous VTE, Older age, Obesity, Smoking, Active cancer, Recent COVID diagnosis (within 3 months), Estrogen therapy, Testosterone therapy, Pregnancy, Central venous catheterization, Paralysis, paresis, or recent plaster cast immobilization of lower extremity, Within 6 weeks postpartum, Recent cesarean section (within 3 months), Sedentary journey lasting >8 hours within 4 weeks, Known thrombophilic condition, Non-malignant, chronic inflammatory condition, Erythropoiesis-stimulating agent  Rx Insurance Coverage: Medicaid Rx Affordability: Eliquis is $4 on Medicaid Preferred Pharmacy: Refills sent to [facility] on [address]  Past Medical History:  Diagnosis Date   Allergy    Arthritis    Depression    Hyperlipidemia     Past Surgical History:  Procedure Laterality Date   LAPAROSCOPY N/A 05/09/2023   Procedure: LAPAROSCOPY DIAGNOSTIC;  Surgeon: Diamantina Monks, MD;  Location: [facility] OR;  Service: General;  Laterality: N/A;   UTERINE FIBROID EMBOLIZATION     2007    Social History   Socioeconomic History   Marital status: Single    Spouse name: Not on file   Number of children: Not on file   Years of education: Not on file   Highest education level: Not on file  Occupational History   Not on file  Tobacco Use   Smoking status: Never    Passive exposure: Never   Smokeless tobacco: Never  Substance and Sexual Activity   Alcohol use: Yes    Comment: Occ Wine   Drug use: Never   Sexual activity: Not on file  Other Topics Concern   Not on file  Social History Narrative   Not on file   Social Determinants of Health   Financial Resource Strain: Not on File (03/19/2022)   Received from [employer] [employer] [place]     Financial Resource Strain: 0  Food Insecurity: No Food Insecurity (05/06/2023)   Hunger Vital Sign    Worried About Running Out of Food in the Last Year: Never true    Ran Out of Food in the Last Year: Never true  Transportation Needs: No Transportation Needs (05/06/2023)   PRAPARE - Administrator, Civil Service (Medical): No    Lack of Transportation (Non-Medical): No  Physical Activity: Not on File (03/19/2022)   Received from [place], [place]   Physical Activity    Physical Activity: 0  Stress: Not on File (03/19/2022)   Received from [facility], [place]   Stress    Stress: 0  Social Connections: Unknown (01/30/2023)   Received from [facility]   Social Network    Social Network: Not on file  Intimate Partner Violence: Not At Risk (05/10/2023)   Humiliation, Afraid, Rape, and Kick questionnaire    Fear of Current or Ex-Partner: No    Emotionally Abused: No    Physically Abused: No    Sexually Abused: No    Family History  Problem Relation Age of Onset   Arthritis Mother    Hearing loss Mother    Hypertension Mother    Arthritis Father    Alcohol abuse Father    Cancer Father    Arthritis Sister    Hypertension Sister    COPD Sister    Cancer Sister    Hypertension Brother    Hyperlipidemia Brother    Diabetes Brother    Hypertension Maternal Grandfather    Hyperlipidemia Maternal Grandfather    Stroke Maternal Grandfather     Allergies as of 05/24/2023 - Review Complete 05/24/2023  Allergen Reaction Noted   Spinach Other (See Comments) 07/21/2018    Current Outpatient Medications on File Prior to Encounter  Medication Sig Dispense Refill   acetaminophen (TYLENOL) 500 MG tablet Take 500-1,000 mg by mouth every 6 (six) hours as needed for moderate pain.     traMADol (ULTRAM) 50 MG tablet Take 1 tablet (50 mg total) by mouth every 6 (six) hours as needed for moderate pain. 15 tablet 0   No current facility-administered medications on file prior to encounter.    REVIEW OF SYSTEMS:  Review of Systems  Respiratory:  Negative for shortness of breath.   Cardiovascular:  Negative for chest pain and palpitations.  Musculoskeletal:  Negative for myalgias.  Neurological:  Negative for dizziness and tingling.   PHYSICAL EXAMINATION:  Vitals:   05/24/23 0949  BP: 117/75  Pulse: 84  SpO2: 99%   Physical Exam Vitals reviewed.  Cardiovascular:     Rate and Rhythm: Normal rate.  Pulmonary:     Effort: Pulmonary effort is normal.  Musculoskeletal:        General: No swelling or tenderness.  Skin:    Findings: No bruising or erythema.  Psychiatric:        Mood and Affect: Mood normal.        Behavior: Behavior normal.        Thought Content: Thought content normal.  LABS:  CBC     Component Value Date/Time   WBC 10.0 05/13/2023 1036   RBC 3.62 (L) 05/13/2023 1036   HGB 11.3 (L) 05/13/2023 1036   HCT 34.1 (L) 05/13/2023 1036   PLT 155 05/13/2023 1036   MCV 94.2 05/13/2023 1036   MCH 31.2 05/13/2023 1036   MCHC 33.1 05/13/2023 1036   RDW 12.5 05/13/2023 1036    Hepatic Function      Component Value Date/Time   PROT 7.1 05/09/2023 0326   ALBUMIN 2.8 (L) 05/09/2023 0326   AST 20 05/09/2023 0326   ALT 23 05/09/2023 0326   ALKPHOS 55 05/09/2023 0326   BILITOT 0.6 05/09/2023 0326    Renal Function   Lab Results  Component Value Date   CREATININE 0.76 05/13/2023   CREATININE 0.72 05/10/2023   CREATININE 0.79 05/09/2023    CrCl cannot be calculated (Unknown ideal weight.).   VVS Vascular Lab Studies:  05/12/23 VAS [place] UPPER EXTREMITY VENOUS DUPLEX LEFT Right:  No evidence of thrombosis in the subclavian.    Left:  No evidence of deep vein thrombosis in the upper extremity. Findings  consistent with acute superficial vein thrombosis involving the left basilic vein,  mid upper arm to proximal forearm, and left cephalic vein, AC fossa to wrist.   05/14/23 CT angio chest PE IMPRESSION: Small peripheral pulmonary emboli are  noted in lower lobe branches of the left pulmonary artery.  Small left pleural effusion is noted with associated left basilar atelectasis.  ASSESSMENT: Location of clot: Left superficial vein (basilic & cephalic veins) + small peripheral PE  Cause of clot: provoked by a transient risk factor - recent hospital admission which involved surgery, bed rest, and repeated IV sticks in the left arm.   The patient is doing much better symptomatically. Chest pain, SOB, and LUE pain have resolved. She is tolerating Eliquis well so far. I extensively counseled the patient on Eliquis today. She has no barriers to adherence or medication access concerns at this time. She was provided refills of Eliquis at hospital discharge but as her refills are currently written, her instructions will still include the starter pack dosing (increased dose to 10 mg BID for the first week). I have sent in refills with updated instructions (5 mg BID) to prevent any dosing errors with each refill. Eliquis is appropriate treatment. No further follow up needed with [place] for the superficial vein thrombosis. Will defer further management of the PE to her PCP. She will need at least 3-6 months of anticoagulation for a first provoked VTE. She was assigned to PCP Hetty Blend, NP, on her new Medicaid card and will be calling soon to schedule an appointment to establish with her. In the meantime, the patient is welcome to reach out here with any questions or concerns regarding her anticoagulation.   PLAN: -Continue apixaban (Eliquis) 5 mg twice daily. -Expected duration of therapy: At least 3-6 months. Therapy started on 05/14/23. -Patient educated on purpose, proper use and potential adverse effects of apixaban (Eliquis). -Discussed importance of taking medication around the same time every day. -Advised patient of medications to avoid (NSAIDs, aspirin doses >100 mg daily). -Educated that Tylenol (acetaminophen) is the preferred analgesic  to lower the risk of bleeding. -Advised patient to alert all providers of anticoagulation therapy prior to starting a new medication or having a procedure. -Emphasized importance of monitoring for signs and symptoms of bleeding (abnormal bruising, prolonged bleeding, nose bleeds, bleeding from gums, discolored urine, black tarry  stools). -Educated patient to present to the ED if emergent signs and symptoms of new thrombosis occur.  Follow up: with PCP for further management. DVT Clinic available as needed.   Pervis Hocking, PharmD, [place]y [place], CPP Deep Vein Thrombosis Clinic Clinical Pharmacist Practitioner Office: [phone]

## 2023-05-24 NOTE — Patient Instructions (Addendum)
-  Continue apixaban (Eliquis) 5 mg twice daily. -Your refills have been sent to your [facility]. You may need to call the pharmacy to ask them to fill this when you start to run low on your current supply.  -Call to schedule an appointment to establish as a new patient with your assigned PCP Hetty Blend, NP. [phone] -It is important to take your medication around the same time every day.  -Avoid NSAIDs like ibuprofen (Advil, Motrin) and naproxen (Aleve) as well as aspirin doses over 100 mg daily. -Tylenol (acetaminophen) is the preferred over the counter pain medication to lower the risk of bleeding. -Be sure to alert all of your health care providers that you are taking an anticoagulant prior to starting a new medication or having a procedure. -Monitor for signs and symptoms of bleeding (abnormal bruising, prolonged bleeding, nose bleeds, bleeding from gums, discolored urine, black tarry stools). If you have fallen and hit your head OR if your bleeding is severe or not stopping, seek emergency care.  -Go to the emergency room if emergent signs and symptoms of new clot occur (new or worse swelling and pain in an arm or leg, shortness of breath, chest pain, fast or irregular heartbeats, lightheadedness, dizziness, fainting, coughing up blood) or if you experience a significant color change (pale or blue) in the extremity that has the DVT.   If you have any questions or need to reschedule an appointment, please call [phone] [facility].  If you are having an emergency, call 911 or present to the nearest emergency room.   What is a DVT?  -Deep vein thrombosis (DVT) is a condition in which a blood clot forms in a vein of the deep venous system which can occur in the lower leg, thigh, pelvis, arm, or neck. This condition is serious and can be life-threatening if the clot travels to the arteries of the lungs and causing a blockage (pulmonary embolism, PE). A DVT can also damage veins in the leg,  which can lead to long-term venous disease, leg pain, swelling, discoloration, and ulcers or sores (post-thrombotic syndrome).  -Treatment may include taking an anticoagulant medication to prevent more clots from forming and the current clot from growing, wearing compression stockings, and/or surgical procedures to remove or dissolve the clot.

## 2023-06-03 DIAGNOSIS — Z419 Encounter for procedure for purposes other than remedying health state, unspecified: Secondary | ICD-10-CM | POA: Diagnosis not present

## 2023-06-11 DIAGNOSIS — R300 Dysuria: Secondary | ICD-10-CM | POA: Diagnosis not present

## 2023-07-04 DIAGNOSIS — Z419 Encounter for procedure for purposes other than remedying health state, unspecified: Secondary | ICD-10-CM | POA: Diagnosis not present

## 2023-07-27 ENCOUNTER — Encounter: Payer: Self-pay | Admitting: Family Medicine

## 2023-07-27 ENCOUNTER — Ambulatory Visit (INDEPENDENT_AMBULATORY_CARE_PROVIDER_SITE_OTHER): Payer: Medicaid Other | Admitting: Family Medicine

## 2023-07-27 VITALS — BP 102/68 | HR 70 | Temp 97.7°F | Ht 67.01 in | Wt 151.0 lb

## 2023-07-27 DIAGNOSIS — E782 Mixed hyperlipidemia: Secondary | ICD-10-CM | POA: Diagnosis not present

## 2023-07-27 DIAGNOSIS — M069 Rheumatoid arthritis, unspecified: Secondary | ICD-10-CM | POA: Diagnosis not present

## 2023-07-27 DIAGNOSIS — N952 Postmenopausal atrophic vaginitis: Secondary | ICD-10-CM | POA: Insufficient documentation

## 2023-07-27 DIAGNOSIS — R5383 Other fatigue: Secondary | ICD-10-CM

## 2023-07-27 DIAGNOSIS — Z7689 Persons encountering health services in other specified circumstances: Secondary | ICD-10-CM

## 2023-07-27 DIAGNOSIS — I2699 Other pulmonary embolism without acute cor pulmonale: Secondary | ICD-10-CM | POA: Diagnosis not present

## 2023-07-27 DIAGNOSIS — D649 Anemia, unspecified: Secondary | ICD-10-CM

## 2023-07-27 DIAGNOSIS — N941 Unspecified dyspareunia: Secondary | ICD-10-CM | POA: Insufficient documentation

## 2023-07-27 DIAGNOSIS — Z800 Family history of malignant neoplasm of digestive organs: Secondary | ICD-10-CM | POA: Diagnosis not present

## 2023-07-27 DIAGNOSIS — M25532 Pain in left wrist: Secondary | ICD-10-CM

## 2023-07-27 DIAGNOSIS — E559 Vitamin D deficiency, unspecified: Secondary | ICD-10-CM | POA: Diagnosis not present

## 2023-07-27 DIAGNOSIS — M79641 Pain in right hand: Secondary | ICD-10-CM | POA: Diagnosis not present

## 2023-07-27 DIAGNOSIS — R87810 Cervical high risk human papillomavirus (HPV) DNA test positive: Secondary | ICD-10-CM | POA: Insufficient documentation

## 2023-07-27 DIAGNOSIS — M79642 Pain in left hand: Secondary | ICD-10-CM

## 2023-07-27 DIAGNOSIS — M25531 Pain in right wrist: Secondary | ICD-10-CM

## 2023-07-27 DIAGNOSIS — Z832 Family history of diseases of the blood and blood-forming organs and certain disorders involving the immune mechanism: Secondary | ICD-10-CM | POA: Insufficient documentation

## 2023-07-27 DIAGNOSIS — Z7901 Long term (current) use of anticoagulants: Secondary | ICD-10-CM | POA: Diagnosis not present

## 2023-07-27 LAB — COMPREHENSIVE METABOLIC PANEL
ALT: 11 U/L (ref 0–35)
AST: 14 U/L (ref 0–37)
Albumin: 3.5 g/dL (ref 3.5–5.2)
Alkaline Phosphatase: 73 U/L (ref 39–117)
BUN: 11 mg/dL (ref 6–23)
CO2: 27 mEq/L (ref 19–32)
Calcium: 9.1 mg/dL (ref 8.4–10.5)
Chloride: 104 mEq/L (ref 96–112)
Creatinine, Ser: 0.62 mg/dL (ref 0.40–1.20)
GFR: 96.11 mL/min (ref >60.00)
Glucose, Bld: 93 mg/dL (ref 70–99)
Potassium: 3.6 mEq/L (ref 3.5–5.1)
Sodium: 139 mEq/L (ref 135–145)
Total Bilirubin: 0.4 mg/dL (ref 0.2–1.2)
Total Protein: 7.7 g/dL (ref 6.0–8.3)

## 2023-07-27 LAB — CBC WITH DIFFERENTIAL/PLATELET
Basophils Absolute: 0 10*3/uL (ref 0.0–0.1)
Basophils Relative: 0.4 % (ref 0.0–3.0)
Eosinophils Absolute: 0.4 10*3/uL (ref 0.0–0.7)
Eosinophils Relative: 4.8 % (ref 0.0–5.0)
HCT: 30.8 % — ABNORMAL LOW (ref 36.0–46.0)
Hemoglobin: 10 g/dL — ABNORMAL LOW (ref 12.0–15.0)
Lymphocytes Relative: 14.9 % (ref 12.0–46.0)
Lymphs Abs: 1.3 10*3/uL (ref 0.7–4.0)
MCHC: 32.6 g/dL (ref 30.0–36.0)
MCV: 92.6 fl (ref 78.0–100.0)
Monocytes Absolute: 0.5 10*3/uL (ref 0.1–1.0)
Monocytes Relative: 5.6 % (ref 3.0–12.0)
Neutro Abs: 6.4 10*3/uL (ref 1.4–7.7)
Neutrophils Relative %: 74.3 % (ref 43.0–77.0)
Platelets: 270 10*3/uL (ref 150.0–400.0)
RBC: 3.32 Mil/uL — ABNORMAL LOW (ref 3.87–5.11)
RDW: 13.6 % (ref 11.5–15.5)
WBC: 8.6 10*3/uL (ref 4.0–10.5)

## 2023-07-27 LAB — VITAMIN D 25 HYDROXY (VIT D DEFICIENCY, FRACTURES): VITD: 27.79 ng/mL — ABNORMAL LOW (ref 30.00–100.00)

## 2023-07-27 LAB — TSH: TSH: 1.36 u[IU]/mL (ref 0.35–5.50)

## 2023-07-27 LAB — T4, FREE: Free T4: 1.12 ng/dL (ref 0.60–1.60)

## 2023-07-27 LAB — VITAMIN B12: Vitamin B-12: 421 pg/mL (ref 211–911)

## 2023-07-27 LAB — FOLATE: Folate: 4.9 ng/mL — ABNORMAL LOW (ref >5.9)

## 2023-07-27 NOTE — Assessment & Plan Note (Signed)
Referral to hematologist

## 2023-07-27 NOTE — Assessment & Plan Note (Signed)
Reports being diagnosed in 2017 or 2018 at rheumatologist in [place]. Will attempt to get records. She was managing well on Tylenol but symptoms hand and wrist pain has been worsening over the past few months. Cannot take NSAIDs due to Eliquis. Referral to rheumatologist.

## 2023-07-27 NOTE — Patient Instructions (Signed)
Please go downstairs for labs.   We will be in touch with your results.   I have referred you to a rheumatologist and hematologist (blood specialist) and you should hear from them to schedule.

## 2023-07-27 NOTE — Assessment & Plan Note (Signed)
Discussed conservative treatment. Continue Tylenol prn. Referral to rheumatologist.

## 2023-07-27 NOTE — Assessment & Plan Note (Signed)
Continue statin. 

## 2023-07-27 NOTE — Assessment & Plan Note (Signed)
UTD with colonoscopy.

## 2023-07-27 NOTE — Progress Notes (Signed)
New Patient Office Visit  Subjective    Patient ID: Sharon Berry, female    DOB: [birth year]  Age: 62 y.o. MRN: [id number]  CC:  Chief Complaint  Patient presents with   Establish Care    Having problems with bilateral hands, dx with rheumatoid arthritis and is really bothering her.   Recently in hospital w blood clot. Wants to see if she needs to get off eliquis    HPI Sharon Berry presents to establish care Previous PCP at [facility]   Surgery for SBO in July   States she developed a PE while hospitalized in July.   States she has a sister with blood clots, unknown bleeding disorder.   Bilateral hand and wrist pain, worsening over the past year.  States she was diagnosed with RA in 2017 or 2018. Previously manageable with Tylenol but now worsening. No numbness or tingling.  She has seen a female rheumatologist on [address] but not since 2017 or 2018.   She is not on HRT  Colonoscopy November 2023 and normal per patient.   Lives with husband. No kids. No pets.   Outpatient Encounter Medications as of 07/27/2023  Medication Sig   acetaminophen (TYLENOL) 500 MG tablet Take 500-1,000 mg by mouth every 6 (six) hours as needed for moderate pain.   apixaban (ELIQUIS) 5 MG TABS tablet Take 1 tablet (5 mg total) by mouth 2 (two) times daily.   [DISCONTINUED] traMADol (ULTRAM) 50 MG tablet Take 1 tablet (50 mg total) by mouth every 6 (six) hours as needed for moderate pain. (Patient not taking: Reported on 07/27/2023)   No facility-administered encounter medications on file as of 07/27/2023.    Past Medical History:  Diagnosis Date   Allergy    Arthritis    Depression    Hyperlipidemia     Past Surgical History:  Procedure Laterality Date   LAPAROSCOPY N/A 05/09/2023   Procedure: LAPAROSCOPY DIAGNOSTIC;  Surgeon: Diamantina Monks, MD;  Location: [facility] OR;  Service: General;  Laterality: N/A;   UTERINE FIBROID EMBOLIZATION     2007    Family History   Problem Relation Age of Onset   Arthritis Mother    Hearing loss Mother    Hypertension Mother    Arthritis Father    Alcohol abuse Father    Cancer Father    Arthritis Sister    Hypertension Sister    COPD Sister    Cancer Sister    Hypertension Brother    Hyperlipidemia Brother    Diabetes Brother    Hypertension Maternal Grandfather    Hyperlipidemia Maternal Grandfather    Stroke Maternal Grandfather     Social History   Socioeconomic History   Marital status: Single    Spouse name: Not on file   Number of children: Not on file   Years of education: Not on file   Highest education level: Not on file  Occupational History   Not on file  Tobacco Use   Smoking status: Never    Passive exposure: Never   Smokeless tobacco: Never  Substance and Sexual Activity   Alcohol use: Yes    Comment: Occ Wine   Drug use: Never   Sexual activity: Not on file  Other Topics Concern   Not on file  Social History Narrative   Not on file   Social Determinants of Health   Financial Resource Strain: Not on File (03/19/2022)   Received from [place], [place]  Financial Resource [employer]    Financial Resource Strain: 0  Food Insecurity: Not on file (07/13/2023)  Transportation Needs: No Transportation Needs (05/06/2023)   PRAPARE - Administrator, Civil Service (Medical): No    Lack of Transportation (Non-Medical): No  Physical Activity: Not on File (03/19/2022)   Received from [place], [place]   Physical Activity    Physical Activity: 0  Stress: Not on File (03/19/2022)   Received from [facility], [place]   Stress    Stress: 0  Social Connections: Not on File (07/17/2023)   Received from [facility]   Social Connections    Connectedness: 0  Intimate Partner Violence: Not At Risk (05/10/2023)   Humiliation, Afraid, Rape, and Kick questionnaire    Fear of Current or Ex-Partner: No    Emotionally Abused: No    Physically Abused: No    Sexually Abused: No    Review of Systems   Constitutional:  Positive for malaise/fatigue. Negative for chills and fever.  Respiratory:  Negative for cough and shortness of breath.   Cardiovascular:  Negative for chest pain, palpitations and leg swelling.  Gastrointestinal:  Negative for abdominal pain, blood in stool, constipation, diarrhea, nausea and vomiting.  Genitourinary:  Negative for dysuria, frequency and urgency.  Musculoskeletal:  Positive for joint pain and myalgias. Negative for falls and neck pain.  Skin:  Negative for rash.  Neurological:  Negative for dizziness, focal weakness and headaches.  Psychiatric/Behavioral:  Negative for depression. The patient is not nervous/anxious.         Objective    BP 102/68 (BP Location: Left Arm, Patient Position: Sitting, Cuff Size: Normal)   Pulse 70   Temp 97.7 F (36.5 C) (Temporal)   Ht 5' 7.01" (1.702 m)   Wt 151 lb (68.5 kg)   SpO2 98%   BMI 23.64 kg/m   Physical Exam Constitutional:      General: She is not in acute distress.    Appearance: She is not ill-appearing.  HENT:     Mouth/Throat:     Mouth: Mucous membranes are moist.     Pharynx: Oropharynx is clear.  Eyes:     Extraocular Movements: Extraocular movements intact.     Conjunctiva/sclera: Conjunctivae normal.  Cardiovascular:     Rate and Rhythm: Normal rate and regular rhythm.  Pulmonary:     Effort: Pulmonary effort is normal.     Breath sounds: Normal breath sounds.  Musculoskeletal:        General: No swelling or tenderness.     Right forearm: Normal.     Left forearm: Normal.     Right wrist: No swelling or bony tenderness. Normal range of motion. Normal pulse.     Left wrist: No swelling or bony tenderness. Normal range of motion. Normal pulse.     Right hand: No swelling or tenderness. Normal range of motion. Normal sensation. Normal capillary refill. Normal pulse.     Left hand: No swelling or tenderness. Normal range of motion. Normal sensation. Normal capillary refill. Normal  pulse.     Cervical back: Normal range of motion and neck supple.     Right lower leg: No edema.     Left lower leg: No edema.     Comments: Pain in bilateral hands with gripping and movement  Skin:    General: Skin is warm and dry.  Neurological:     General: No focal deficit present.     Mental Status: She is alert and oriented  to person, place, and time.     Motor: No weakness.     Coordination: Coordination normal.     Gait: Gait normal.  Psychiatric:        Mood and Affect: Mood normal.        Behavior: Behavior normal.        Thought Content: Thought content normal.         Assessment & Plan:   Problem List Items Addressed This Visit       Cardiovascular and Mediastinum   Pulmonary embolism without acute cor pulmonale (HCC) - Primary    Asymptomatic. Continue Eliquis. Referral to hematology for further work up and to determine course of Eliquis. Sister with clotting disorder.       Relevant Orders   Ambulatory referral to Hematology / Oncology   Comprehensive metabolic panel (Completed)   CBC with Differential/Platelet (Completed)     Musculoskeletal and Integument   Rheumatoid arthritis (HCC) (Chronic)    Reports being diagnosed in 2017 or 2018 at rheumatologist in [place]. Will attempt to get records. She was managing well on Tylenol but symptoms hand and wrist pain has been worsening over the past few months. Cannot take NSAIDs due to Eliquis. Referral to rheumatologist.       Relevant Orders   Ambulatory referral to Rheumatology     Other   Anemia    Check CBC, iron studies, B12 and folate. Appreciate hematology input if she is still anemic.       Relevant Orders   CBC with Differential/Platelet (Completed)   Vitamin B12 (Completed)   Iron, TIBC and Ferritin Panel   Folate (Completed)   Bilateral hand pain    Discussed conservative treatment. Continue Tylenol prn. Referral to rheumatologist.       Relevant Orders   Ambulatory referral to  Rheumatology   Bilateral wrist pain    Discussed conservative treatment. Continue Tylenol prn. Referral to rheumatologist.      Relevant Orders   Ambulatory referral to Rheumatology   Family history of bleeding or clotting disorder    Referral to hematologist.       Relevant Orders   Ambulatory referral to Hematology / Oncology   Family history of colon cancer    UTD with colonoscopy.       Mixed hyperlipidemia    Continue statin      On continuous oral anticoagulation   Vitamin D deficiency    Check vitamin D level and replace as warranted.       Relevant Orders   VITAMIN D 25 Hydroxy (Vit-D Deficiency, Fractures) (Completed)   Other Visit Diagnoses     Encounter to establish care       Fatigue, unspecified type       Relevant Orders   CBC with Differential/Platelet (Completed)   TSH (Completed)   T4, free (Completed)   Vitamin B12 (Completed)   Iron, TIBC and Ferritin Panel   Folate (Completed)       Return in about 4 weeks (around 08/24/2023) for chronic health conditions.   Hetty Blend, NP-C

## 2023-07-27 NOTE — Assessment & Plan Note (Signed)
Asymptomatic. Continue Eliquis. Referral to hematology for further work up and to determine course of Eliquis. Sister with clotting disorder.

## 2023-07-27 NOTE — Assessment & Plan Note (Signed)
Check CBC, iron studies, B12 and folate. Appreciate hematology input if she is still anemic.

## 2023-07-27 NOTE — Assessment & Plan Note (Signed)
Check vitamin D level and replace as warranted

## 2023-07-28 ENCOUNTER — Encounter: Payer: Self-pay | Admitting: Family Medicine

## 2023-07-28 LAB — IRON,TIBC AND FERRITIN PANEL
%SAT: 16 % (calc) (ref 16–45)
Ferritin: 121 ng/mL (ref 16–288)
Iron: 45 ug/dL (ref 45–160)
TIBC: 279 mcg/dL (calc) (ref 250–450)

## 2023-08-01 ENCOUNTER — Other Ambulatory Visit: Payer: Self-pay | Admitting: Family Medicine

## 2023-08-01 DIAGNOSIS — E538 Deficiency of other specified B group vitamins: Secondary | ICD-10-CM | POA: Insufficient documentation

## 2023-08-01 MED ORDER — FOLIC ACID 1 MG PO TABS
1.0000 mg | ORAL_TABLET | Freq: Every day | ORAL | 0 refills | Status: AC
Start: 2023-08-01 — End: ?

## 2023-08-01 NOTE — Progress Notes (Signed)
Her anemia is slightly worse than in July. Please ask her to discuss this with her hematologist at her upcoming visit. Her folate is also low which is contributing to her anemia.  I will send in a prescription for her to take daily. I also recommend that she take an over the counter multivitamin since her vitamin D is slightly low and her vitamin B12 is low normal.

## 2023-08-03 DIAGNOSIS — Z419 Encounter for procedure for purposes other than remedying health state, unspecified: Secondary | ICD-10-CM | POA: Diagnosis not present

## 2023-08-16 ENCOUNTER — Telehealth: Payer: Self-pay | Admitting: Family Medicine

## 2023-08-16 NOTE — Telephone Encounter (Signed)
Pt called checking on the status of her referral and I asked pt have she signed the ROI and she stated yes. Please advise.

## 2023-08-17 ENCOUNTER — Other Ambulatory Visit: Payer: Self-pay | Admitting: Family Medicine

## 2023-08-17 DIAGNOSIS — M25531 Pain in right wrist: Secondary | ICD-10-CM

## 2023-08-17 DIAGNOSIS — M069 Rheumatoid arthritis, unspecified: Secondary | ICD-10-CM

## 2023-08-17 DIAGNOSIS — M79641 Pain in right hand: Secondary | ICD-10-CM

## 2023-08-17 NOTE — Telephone Encounter (Signed)
Pt called back and I was able to confirm previous rheumatologist records are uploaded in chart under media tab, sent message to referrals to please move forward w referral

## 2023-08-17 NOTE — Telephone Encounter (Signed)
Checked up front and ROI from our office has not been signed.  LM for pt to let us know which form she signed/ if it was from our office

## 2023-08-18 ENCOUNTER — Telehealth: Payer: Self-pay | Admitting: Hematology and Oncology

## 2023-08-18 ENCOUNTER — Telehealth: Payer: Self-pay

## 2023-08-18 NOTE — Telephone Encounter (Signed)
Rescheduled appointment per 10/16 secure chat. Patient is aware of the changes made to her upcoming appointments.

## 2023-08-18 NOTE — Telephone Encounter (Signed)
Called to reschedule appointment per provider; 10/16 secure chat. Left VM for patient to call back.

## 2023-08-19 ENCOUNTER — Inpatient Hospital Stay: Payer: Medicaid Other | Admitting: Hematology and Oncology

## 2023-08-19 ENCOUNTER — Inpatient Hospital Stay: Payer: Medicaid Other

## 2023-08-31 NOTE — Progress Notes (Unsigned)
[facility] CONSULT NOTE  Patient Care Team: Avanell Shackleton, NP-C as PCP - General (Family Medicine)   ASSESSMENT & PLAN:  62 y.o.female referred to [facility] for history of DVT.    She has no diabetes, cigarette smoking history, inflammatory disease.  First episode: LUE DVT and Small peripheral pulmonary emboli Setting: postop and inpatient for SBO Treatment: apixaban  Patient is recovering well from PE.  No symptoms.  Discussed provoked versus unprovoked surgery or pulmonary embolism.   May discontinue anticoagulation at this time. Monitor for new signs or DVT and PE in the future.  Education information given as below.  Patient education for risk factors and prevention of clotting We talked about modifiable risk factors.  Prevention of clotting like deep vein thrombosis including: Strong risk factors including fractures of lower limb, hospitalization for severe illness, such as heart failure, myocardial infarction, spinal cord injury, major trauma, hip or knee replacement, and previous VTE. avoid prolonged immobilization and moving extremities every 1-2 hours during long car rides or flights.  Taking a break and moving extremities if working in a job setting with prolonged sitting.   Avoid dehydration, especially in high altitude. Avoid cigarette smoking Avoid use of hormone replacement therapy, estrogen-containing contraceptive in women.   Maintaining healthy lifestyle to prevent development of diabetes.  Weight loss if BMI over 30.  Regular exercises but not extreme.   Other risk factors for clotting are surgery, hospitalization, inflammatory disease or severe infection, trauma or injuries from inflammatory state and stasis. If developing one-sided leg swelling, pain, color change, chest pain, sudden short of breath, difficulty taking deep breaths, taking deep breath with chest discomfort or pain, dizziness or heart racing  sensation, go to the emergency room immediately for evaluation. If developing trauma, uncontrolled bleeding, such as bloody stools report ED immediately. Avoid NSAIDs, aspirin while on blood thinner.  Patient should avoid elective surgery in the acute thrombosis period for 3 months.  Discussed bleeding precautions and avoid high risk activities for falling while on anticoagulation. Anticoagulants do not cause bleeding.  Rather, if bleeding occurs when one is on anticoagulation, it may take longer for the bleeding to stop due to reduce coagulation capacity.  Finally, at the end of our consultation today, I reinforced the importance of preventive strategies such as avoiding hormonal supplement, avoiding cigarette smoking, keeping up-to-date with screening programs for early cancer detection, frequent ambulation for long distance travel and aggressive DVT prophylaxis in all surgical settings.  I have not made a return appointment for the patient to come back. I would be happy to assist in the future if needed.  No orders of the defined types were placed in this encounter.  All questions were answered. The patient knows to call the clinic with any problems, questions or concerns.  Melven Sartorius, MD 10/31/20241:08 PM  CHIEF COMPLAINTS/PURPOSE OF CONSULTATION:  History of DVT  HISTORY OF PRESENTING ILLNESS:  Sharon Berry 62 y.o. female is here because of DVT and PE in July. Sister with clotting disorder.   05/05/23 admitted for small bowel obstruction 05/09/2023  Procedure: diagnostic laparoscopy, lysis of single adhesive band She noted left lower chest wall pain. No arm swelling or leg swelling or pain. 05/12/23 [place] UE: No evidence of deep vein thrombosis in the upper extremity. Findings consistent with acute superficial vein thrombosis involving the left basilic vein, mid upper arm to proximal forearm, and left cephalic vein, AC fossa to wrist.  05/14/23  while inpatient c/o LUQ/left  chest wall pain worse with movement or inspiration.  CTA: Small peripheral pulmonary emboli are noted in lower lobe branches of the left pulmonary artery.   MEDICAL HISTORY:  Past Medical History:  Diagnosis Date   Allergy    Arthritis    Depression    Hyperlipidemia     SURGICAL HISTORY: Past Surgical History:  Procedure Laterality Date   LAPAROSCOPY N/A 05/09/2023   Procedure: LAPAROSCOPY DIAGNOSTIC;  Surgeon: Diamantina Monks, MD;  Location: [facility] OR;  Service: General;  Laterality: N/A;   UTERINE FIBROID EMBOLIZATION     2007    SOCIAL HISTORY: Social History   Socioeconomic History   Marital status: Single    Spouse name: Not on file   Number of children: Not on file   Years of education: Not on file   Highest education level: Not on file  Occupational History   Not on file  Tobacco Use   Smoking status: Never    Passive exposure: Never   Smokeless tobacco: Never  Substance and Sexual Activity   Alcohol use: Yes    Comment: Occ Wine   Drug use: Never   Sexual activity: Not on file  Other Topics Concern   Not on file  Social History Narrative   Not on file   Social Determinants of Health   Financial Resource Strain: Not on File (03/19/2022)   Received from [employer] [employer]    Financial Resource Strain: 0  Food Insecurity: No Food Insecurity (09/02/2023)   Hunger Vital Sign    Worried About Running Out of Food in the Last Year: Never true    Ran Out of Food in the Last Year: Never true  Transportation Needs: No Transportation Needs (09/02/2023)   PRAPARE - Administrator, Civil Service (Medical): No    Lack of Transportation (Non-Medical): No  Physical Activity: Not on File (03/19/2022)   Received from [place], [place]   Physical Activity    Physical Activity: 0  Stress: Not on File (03/19/2022)   Received from [facility], [place]   Stress    Stress: 0  Social Connections: Not on File (07/17/2023)   Received from [facility]   Social  Connections    Connectedness: 0  Intimate Partner Violence: Not At Risk (09/02/2023)   Humiliation, Afraid, Rape, and Kick questionnaire    Fear of Current or Ex-Partner: No    Emotionally Abused: No    Physically Abused: No    Sexually Abused: No    FAMILY HISTORY: Family History  Problem Relation Age of Onset   Arthritis Mother    Hearing loss Mother    Hypertension Mother    Arthritis Father    Alcohol abuse Father    Cancer Father    Arthritis Sister    Hypertension Sister    COPD Sister    Cancer Sister    Hypertension Brother    Hyperlipidemia Brother    Diabetes Brother    Hypertension Maternal Grandfather    Hyperlipidemia Maternal Grandfather    Stroke Maternal Grandfather     ALLERGIES:  is allergic to spinach.  MEDICATIONS:  Current Outpatient Medications  Medication Sig Dispense Refill   acetaminophen (TYLENOL) 500 MG tablet Take 500-1,000 mg by mouth every 6 (six) hours as needed for moderate pain.     apixaban (ELIQUIS) 5 MG TABS tablet Take 1 tablet (5 mg total) by mouth 2 (two) times daily. 60 tablet 5  folic acid (FOLVITE) 1 MG tablet Take 1 tablet (1 mg total) by mouth daily. (Patient not taking: Reported on 09/02/2023) 90 tablet 0   No current facility-administered medications for this visit.    REVIEW OF SYSTEMS:   Constitutional: Denies fevers Respiratory: Denies cough, shortness of breath or wheezes Cardiovascular: Denies palpitation, chest discomfort or heart racing sensation Gastrointestinal:  Denies abdominal pain  PHYSICAL EXAMINATION: ECOG PERFORMANCE STATUS: 0 - Asymptomatic  Vitals:   09/02/23 0933  BP: (!) 138/92  Pulse: 72  Resp: 14  Temp: (!) 97.2 F (36.2 C)  SpO2: 100%   Filed Weights   09/02/23 0933  Weight: 147 lb 14.4 oz (67.1 kg)    GENERAL: alert, no distress and comfortable LUNGS: Effort normal and no respiratory distress.  Musculoskeletal: no cyanosis and edema.  Right lower extremity & left lower extremity:  no edema  LABORATORY DATA:  I have reviewed the data as listed   RADIOGRAPHIC STUDIES: I have personally reviewed the radiological images as listed and agreed with the findings in the report. No results found.

## 2023-09-02 ENCOUNTER — Inpatient Hospital Stay: Payer: Medicaid Other

## 2023-09-02 ENCOUNTER — Inpatient Hospital Stay: Payer: Medicaid Other | Attending: Hematology and Oncology

## 2023-09-02 VITALS — BP 138/92 | HR 72 | Temp 97.2°F | Resp 14 | Wt 147.9 lb

## 2023-09-02 DIAGNOSIS — Z7901 Long term (current) use of anticoagulants: Secondary | ICD-10-CM | POA: Insufficient documentation

## 2023-09-02 DIAGNOSIS — Z86711 Personal history of pulmonary embolism: Secondary | ICD-10-CM | POA: Diagnosis not present

## 2023-09-02 DIAGNOSIS — Z86718 Personal history of other venous thrombosis and embolism: Secondary | ICD-10-CM | POA: Insufficient documentation

## 2023-09-02 NOTE — Patient Instructions (Signed)
Patient education for risk factors and prevention of clotting We talked about modifiable risk factors.  Prevention of clotting like deep vein thrombosis including: Strong risk factors including fractures of lower limb, hospitalization for severe illness, such as heart failure, myocardial infarction, spinal cord injury, major trauma, hip or knee replacement, and previous blood clot. avoid prolonged immobilization and moving extremities every 1-2 hours during long car rides or flights.  Taking a break and moving extremities if working in a job setting with prolonged sitting.   Avoid dehydration, especially in high altitude. Avoid cigarette smoking Avoid use of hormone replacement therapy, estrogen-containing contraceptive in women.   Maintaining healthy lifestyle to prevent development of diabetes.  Weight loss if BMI over 30.  Regular exercises but not extreme.   Other risk factors for clotting are surgery, hospitalization, inflammatory disease or severe infection, trauma or injuries from inflammatory state and stasis. If developing one-sided leg swelling, pain, color change, chest pain, sudden short of breath, difficulty taking deep breaths, taking deep breath with chest discomfort or pain, dizziness or heart racing sensation, go to the emergency room immediately for evaluation. If developing trauma, uncontrolled bleeding, such as bloody stools report ED immediately. Avoid NSAIDs, aspirin while on blood thinner.  Patient should avoid elective surgery in the acute thrombosis period for 3 months.  Discussed bleeding precautions and avoid high risk activities for falling while on anticoagulation. Anticoagulants do not cause bleeding.  Rather, if bleeding occurs when one is on anticoagulation, it may take longer for the bleeding to stop due to reduce coagulation capacity.

## 2023-12-10 ENCOUNTER — Other Ambulatory Visit: Payer: Self-pay | Admitting: Cardiology

## 2023-12-11 LAB — LIPID PANEL W/O CHOL/HDL RATIO
Cholesterol, Total: 246 mg/dL — ABNORMAL HIGH (ref 100–199)
HDL: 69 mg/dL (ref >39)
LDL Chol Calc (NIH): 168 mg/dL — ABNORMAL HIGH (ref 0–99)
Triglycerides: 54 mg/dL (ref 0–149)
VLDL Cholesterol Cal: 9 mg/dL (ref 5–40)

## 2023-12-14 ENCOUNTER — Telehealth: Payer: Self-pay

## 2023-12-14 NOTE — Telephone Encounter (Signed)
Attempted to contact patient regarding lab results. Left message on voicemail requesting return call.

## 2024-01-21 ENCOUNTER — Encounter: Payer: Medicaid Other | Admitting: Internal Medicine
